# Patient Record
Sex: Female | Born: 1970 | Race: White | Hispanic: No | Marital: Married | Smoking: Never smoker
Health system: Southern US, Community
[De-identification: ages and names within clinical notes are randomized; demographics above are authoritative.]

## PROBLEM LIST (undated history)

## (undated) DIAGNOSIS — F32A Depression, unspecified: Secondary | ICD-10-CM

## (undated) DIAGNOSIS — S8992XA Unspecified injury of left lower leg, initial encounter: Secondary | ICD-10-CM

## (undated) DIAGNOSIS — O139 Gestational [pregnancy-induced] hypertension without significant proteinuria, unspecified trimester: Secondary | ICD-10-CM

## (undated) DIAGNOSIS — R51 Headache: Secondary | ICD-10-CM

## (undated) DIAGNOSIS — R42 Dizziness and giddiness: Secondary | ICD-10-CM

## (undated) DIAGNOSIS — F329 Major depressive disorder, single episode, unspecified: Secondary | ICD-10-CM

## (undated) DIAGNOSIS — G4700 Insomnia, unspecified: Secondary | ICD-10-CM

## (undated) DIAGNOSIS — M7750 Other enthesopathy of unspecified foot: Secondary | ICD-10-CM

## (undated) HISTORY — DX: Unspecified injury of left lower leg, initial encounter: S89.92XA

## (undated) HISTORY — PX: WISDOM TOOTH EXTRACTION: SHX21

## (undated) HISTORY — PX: OTHER SURGICAL HISTORY: SHX169

## (undated) HISTORY — DX: Dizziness and giddiness: R42

## (undated) HISTORY — DX: Insomnia, unspecified: G47.00

## (undated) HISTORY — PX: IUD REMOVAL: SHX5392

## (undated) HISTORY — DX: Other enthesopathy of unspecified foot and ankle: M77.50

---

## 2001-10-09 ENCOUNTER — Other Ambulatory Visit: Admission: RE | Admit: 2001-10-09 | Discharge: 2001-10-09 | Payer: Self-pay | Admitting: Obstetrics and Gynecology

## 2002-03-14 ENCOUNTER — Inpatient Hospital Stay (HOSPITAL_COMMUNITY): Admission: AD | Admit: 2002-03-14 | Discharge: 2002-03-14 | Payer: Self-pay | Admitting: Obstetrics and Gynecology

## 2002-03-18 ENCOUNTER — Inpatient Hospital Stay: Admission: AD | Admit: 2002-03-18 | Discharge: 2002-03-18 | Payer: Self-pay | Admitting: Obstetrics and Gynecology

## 2002-03-18 ENCOUNTER — Encounter: Payer: Self-pay | Admitting: Obstetrics and Gynecology

## 2002-08-24 ENCOUNTER — Inpatient Hospital Stay (HOSPITAL_COMMUNITY): Admission: AD | Admit: 2002-08-24 | Discharge: 2002-08-24 | Payer: Self-pay | Admitting: Obstetrics and Gynecology

## 2002-09-25 ENCOUNTER — Inpatient Hospital Stay (HOSPITAL_COMMUNITY): Admission: AD | Admit: 2002-09-25 | Discharge: 2002-09-25 | Payer: Self-pay | Admitting: Obstetrics and Gynecology

## 2002-11-05 ENCOUNTER — Inpatient Hospital Stay (HOSPITAL_COMMUNITY): Admission: AD | Admit: 2002-11-05 | Discharge: 2002-11-05 | Payer: Self-pay | Admitting: Obstetrics and Gynecology

## 2002-11-05 ENCOUNTER — Inpatient Hospital Stay (HOSPITAL_COMMUNITY): Admission: AD | Admit: 2002-11-05 | Discharge: 2002-11-07 | Payer: Self-pay | Admitting: Obstetrics and Gynecology

## 2002-12-20 ENCOUNTER — Other Ambulatory Visit: Admission: RE | Admit: 2002-12-20 | Discharge: 2002-12-20 | Payer: Self-pay | Admitting: *Deleted

## 2003-10-25 ENCOUNTER — Encounter: Admission: RE | Admit: 2003-10-25 | Discharge: 2003-10-25 | Payer: Self-pay | Admitting: Family Medicine

## 2003-11-15 ENCOUNTER — Encounter: Admission: RE | Admit: 2003-11-15 | Discharge: 2003-11-15 | Payer: Self-pay | Admitting: Family Medicine

## 2003-12-11 ENCOUNTER — Encounter: Admission: RE | Admit: 2003-12-11 | Discharge: 2003-12-11 | Payer: Self-pay | Admitting: Family Medicine

## 2004-02-11 ENCOUNTER — Other Ambulatory Visit: Admission: RE | Admit: 2004-02-11 | Discharge: 2004-02-11 | Payer: Self-pay | Admitting: Obstetrics and Gynecology

## 2004-03-18 ENCOUNTER — Other Ambulatory Visit: Admission: RE | Admit: 2004-03-18 | Discharge: 2004-03-18 | Payer: Self-pay | Admitting: Obstetrics and Gynecology

## 2004-07-09 ENCOUNTER — Inpatient Hospital Stay (HOSPITAL_COMMUNITY): Admission: AD | Admit: 2004-07-09 | Discharge: 2004-07-09 | Payer: Self-pay | Admitting: Obstetrics and Gynecology

## 2004-10-13 ENCOUNTER — Ambulatory Visit (HOSPITAL_COMMUNITY): Admission: RE | Admit: 2004-10-13 | Discharge: 2004-10-13 | Payer: Self-pay | Admitting: Obstetrics and Gynecology

## 2004-10-16 ENCOUNTER — Inpatient Hospital Stay (HOSPITAL_COMMUNITY): Admission: RE | Admit: 2004-10-16 | Discharge: 2004-10-16 | Payer: Self-pay | Admitting: Obstetrics and Gynecology

## 2005-01-02 ENCOUNTER — Inpatient Hospital Stay (HOSPITAL_COMMUNITY): Admission: AD | Admit: 2005-01-02 | Discharge: 2005-01-02 | Payer: Self-pay | Admitting: Obstetrics and Gynecology

## 2005-01-11 ENCOUNTER — Ambulatory Visit (HOSPITAL_COMMUNITY): Admission: RE | Admit: 2005-01-11 | Discharge: 2005-01-11 | Payer: Self-pay | Admitting: Obstetrics and Gynecology

## 2005-02-16 ENCOUNTER — Encounter (INDEPENDENT_AMBULATORY_CARE_PROVIDER_SITE_OTHER): Payer: Self-pay | Admitting: *Deleted

## 2005-02-16 ENCOUNTER — Inpatient Hospital Stay (HOSPITAL_COMMUNITY): Admission: AD | Admit: 2005-02-16 | Discharge: 2005-02-18 | Payer: Self-pay | Admitting: Obstetrics and Gynecology

## 2005-03-31 ENCOUNTER — Other Ambulatory Visit: Admission: RE | Admit: 2005-03-31 | Discharge: 2005-03-31 | Payer: Self-pay | Admitting: Obstetrics and Gynecology

## 2006-05-13 ENCOUNTER — Other Ambulatory Visit: Admission: RE | Admit: 2006-05-13 | Discharge: 2006-05-13 | Payer: Self-pay | Admitting: Obstetrics and Gynecology

## 2006-05-19 ENCOUNTER — Ambulatory Visit: Payer: Self-pay | Admitting: Family Medicine

## 2006-05-23 ENCOUNTER — Encounter: Admission: RE | Admit: 2006-05-23 | Discharge: 2006-05-23 | Payer: Self-pay

## 2006-12-05 ENCOUNTER — Ambulatory Visit: Payer: Self-pay | Admitting: Family Medicine

## 2007-03-14 ENCOUNTER — Inpatient Hospital Stay (HOSPITAL_COMMUNITY): Admission: AD | Admit: 2007-03-14 | Discharge: 2007-03-14 | Payer: Self-pay | Admitting: Obstetrics and Gynecology

## 2007-03-21 ENCOUNTER — Inpatient Hospital Stay (HOSPITAL_COMMUNITY): Admission: RE | Admit: 2007-03-21 | Discharge: 2007-03-21 | Payer: Self-pay | Admitting: Obstetrics and Gynecology

## 2007-06-21 ENCOUNTER — Inpatient Hospital Stay (HOSPITAL_COMMUNITY): Admission: AD | Admit: 2007-06-21 | Discharge: 2007-06-21 | Payer: Self-pay | Admitting: Obstetrics and Gynecology

## 2007-06-30 ENCOUNTER — Ambulatory Visit: Payer: Self-pay | Admitting: Internal Medicine

## 2007-08-22 ENCOUNTER — Inpatient Hospital Stay (HOSPITAL_COMMUNITY): Admission: RE | Admit: 2007-08-22 | Discharge: 2007-08-24 | Payer: Self-pay | Admitting: Obstetrics and Gynecology

## 2008-01-31 ENCOUNTER — Encounter: Admission: RE | Admit: 2008-01-31 | Discharge: 2008-01-31 | Payer: Self-pay | Admitting: Obstetrics and Gynecology

## 2008-02-22 ENCOUNTER — Ambulatory Visit (HOSPITAL_COMMUNITY): Admission: RE | Admit: 2008-02-22 | Discharge: 2008-02-22 | Payer: Self-pay | Admitting: Obstetrics and Gynecology

## 2008-08-20 ENCOUNTER — Ambulatory Visit: Payer: Self-pay | Admitting: Family Medicine

## 2008-08-20 DIAGNOSIS — G4700 Insomnia, unspecified: Secondary | ICD-10-CM | POA: Insufficient documentation

## 2008-08-21 ENCOUNTER — Encounter (INDEPENDENT_AMBULATORY_CARE_PROVIDER_SITE_OTHER): Payer: Self-pay | Admitting: *Deleted

## 2008-08-21 ENCOUNTER — Telehealth (INDEPENDENT_AMBULATORY_CARE_PROVIDER_SITE_OTHER): Payer: Self-pay | Admitting: *Deleted

## 2008-08-21 DIAGNOSIS — F329 Major depressive disorder, single episode, unspecified: Secondary | ICD-10-CM | POA: Insufficient documentation

## 2008-08-21 DIAGNOSIS — F321 Major depressive disorder, single episode, moderate: Secondary | ICD-10-CM | POA: Insufficient documentation

## 2008-08-21 LAB — CONVERTED CEMR LAB
Basophils Absolute: 0.1 10*3/uL (ref 0.0–0.1)
Calcium: 9.1 mg/dL (ref 8.4–10.5)
Eosinophils Absolute: 0.1 10*3/uL (ref 0.0–0.7)
GFR calc Af Amer: 104 mL/min
GFR calc non Af Amer: 86 mL/min
Lymphocytes Relative: 27.5 % (ref 12.0–46.0)
MCHC: 34.9 g/dL (ref 30.0–36.0)
MCV: 89.4 fL (ref 78.0–100.0)
Neutrophils Relative %: 65.1 % (ref 43.0–77.0)
Platelets: 193 10*3/uL (ref 150–400)
Potassium: 4.1 meq/L (ref 3.5–5.1)
RDW: 12 % (ref 11.5–14.6)
Sodium: 140 meq/L (ref 135–145)
Vitamin B-12: 290 pg/mL (ref 211–911)

## 2008-09-20 ENCOUNTER — Ambulatory Visit: Payer: Self-pay | Admitting: Family Medicine

## 2008-09-20 DIAGNOSIS — N61 Mastitis without abscess: Secondary | ICD-10-CM | POA: Insufficient documentation

## 2008-10-28 ENCOUNTER — Emergency Department (HOSPITAL_COMMUNITY): Admission: EM | Admit: 2008-10-28 | Discharge: 2008-10-28 | Payer: Self-pay | Admitting: Emergency Medicine

## 2009-05-30 ENCOUNTER — Ambulatory Visit: Payer: Self-pay | Admitting: Family Medicine

## 2009-05-30 DIAGNOSIS — M7710 Lateral epicondylitis, unspecified elbow: Secondary | ICD-10-CM | POA: Insufficient documentation

## 2009-06-06 ENCOUNTER — Telehealth: Payer: Self-pay | Admitting: Family Medicine

## 2009-06-06 LAB — CONVERTED CEMR LAB: Vit D, 25-Hydroxy: 41 ng/mL (ref 30–89)

## 2009-11-25 ENCOUNTER — Ambulatory Visit: Payer: Self-pay | Admitting: Family Medicine

## 2010-03-23 ENCOUNTER — Ambulatory Visit: Payer: Self-pay | Admitting: Family Medicine

## 2010-04-21 ENCOUNTER — Encounter (INDEPENDENT_AMBULATORY_CARE_PROVIDER_SITE_OTHER): Payer: Self-pay | Admitting: Nurse Practitioner

## 2010-09-18 ENCOUNTER — Telehealth (INDEPENDENT_AMBULATORY_CARE_PROVIDER_SITE_OTHER): Payer: Self-pay | Admitting: *Deleted

## 2010-10-13 NOTE — Assessment & Plan Note (Signed)
Summary: STREP THROAT/KDC   Vital Signs:  Patient profile:   40 year old female Height:      63 inches Weight:      163 pounds BMI:     28.98 Temp:     98.3 degrees F oral Pulse rate:   78 / minute Pulse rhythm:   regular BP sitting:   110 / 82  (left arm) Cuff size:   regular  Vitals Entered By: Army Fossa CMA (November 25, 2009 2:48 PM) CC: Pt here c/o sore throat and fever since last night. Fever got up to 100.3    History of Present Illness: Pt sore throat and fever x1 day.   No other symptoms.  No otc meds.    Current Medications (verified): 1)  Prozac 40 Mg Caps (Fluoxetine Hcl) .... Take 1 Tab Once Daily 2)  Implanon 68 Mg Impl (Etonogestrel) .... Use As Directed 3)  Calcium-Vitamin D 250-125 Mg-Unit Tabs (Calcium Carbonate-Vitamin D) .Marland Kitchen.. 1 By Mouth Once Daily. 4)  Vitamin D3 2000 Unit Caps (Cholecalciferol) .Marland Kitchen.. 1 By Mouth Daily.  Allergies (verified): No Known Drug Allergies  Past History:  Past Medical History: Last updated: 08/20/2008 Depression Current Problems:  INSOMNIA (ICD-780.52) DEPRESSION (ICD-311) DIZZINESS (ICD-780.4) VACCINE AGAINST INFLUENZA (ICD-V04.81)  Review of Systems      See HPI  Physical Exam  General:  Well-developed,well-nourished,in no acute distress; alert,appropriate and cooperative throughout examination Ears:  External ear exam shows no significant lesions or deformities.  Otoscopic examination reveals clear canals, tympanic membranes are intact bilaterally without bulging, retraction, inflammation or discharge. Hearing is grossly normal bilaterally. Nose:  External nasal examination shows no deformity or inflammation. Nasal mucosa are pink and moist without lesions or exudates. Mouth:  pharyngeal erythema, halitosis, and pharyngeal exudate.   Neck:  No deformities, masses, or tenderness noted. Lungs:  Normal respiratory effort, chest expands symmetrically. Lungs are clear to auscultation, no crackles or wheezes. Psych:   Oriented X3 and normally interactive.     Impression & Recommendations:  Problem # 1:  ACUTE PHARYNGITIS (ICD-462)  clinically strep  Orders: T-Culture, Throat (04540-98119) Admin of Therapeutic Inj  intramuscular or subcutaneous (14782) Bicillin CR 1.2 million units Injection (N5621) Rapid Strep (30865)  Instructed to complete antibiotics and call if not improved in 48 hours.   Complete Medication List: 1)  Prozac 40 Mg Caps (Fluoxetine hcl) .... Take 1 tab once daily 2)  Implanon 68 Mg Impl (Etonogestrel) .... Use as directed 3)  Calcium-vitamin D 250-125 Mg-unit Tabs (Calcium carbonate-vitamin d) .Marland Kitchen.. 1 by mouth once daily. 4)  Vitamin D3 2000 Unit Caps (Cholecalciferol) .Marland Kitchen.. 1 by mouth daily.  Patient Instructions: 1)  out of work 24 hours 2)  Take 400-600 mg of Ibuprofen (Advil, Motrin) with food every 4-6 hours as needed  for relief of pain or comfort of fever.    Medication Administration  Injection # 1:    Medication: Bicillin CR 1.2 million units Injection    Diagnosis: ACUTE PHARYNGITIS (ICD-462)    Route: IM    Site: RUOQ gluteus    Exp Date: 10/2011    Lot #: [id number]    Mfr: [employer]  Orders Added: 1)  T-Culture, Throat [[id number]] 2)  Admin of Therapeutic Inj  intramuscular or subcutaneous [96372] 3)  Bicillin CR 1.2 million units Injection [J0559] 4)  Est. Patient Level III [44010] 5)  Rapid Strep [27253]

## 2010-10-13 NOTE — Assessment & Plan Note (Signed)
Summary: rto 6 months.cbs   Vital Signs:  Patient profile:   40 year old female Height:      63 inches Weight:      178 pounds Temp:     98.2 degrees F oral Pulse rate:   73 / minute BP sitting:   80 / 50  (left arm)  Vitals Entered By: Jeremy Johann CMA (March 23, 2010 11:09 AM)  Serial Vital Signs/Assessments:  Time      Position  BP       Pulse  Resp  Temp     By                     98/62                          Loreen Freud DO  CC: 6 month f/u, disscuss med Comments REVIEWED MED LIST, PATIENT AGREED DOSE AND INSTRUCTION CORRECT    History of Present Illness: Pt here f/u prozac.  Pt has noticed recently increased moodiness and depression.  No known triggers. Pt is not suicidal.  Current Medications (verified): 1)  Prozac 20 Mg Caps (Fluoxetine Hcl) .... 3 By Mouth Once Daily 2)  Implanon 68 Mg Impl (Etonogestrel) .... Use As Directed  Allergies (verified): No Known Drug Allergies  Past History:  Past medical, surgical, family and social histories (including risk factors) reviewed for relevance to current acute and chronic problems.  Past Medical History: Reviewed history from 08/20/2008 and no changes required. Depression Current Problems:  INSOMNIA (ICD-780.52) DEPRESSION (ICD-311) DIZZINESS (ICD-780.4) VACCINE AGAINST INFLUENZA (ICD-V04.81)  Family History: Reviewed history and no changes required.  Social History: Reviewed history and no changes required.  Review of Systems      See HPI  Physical Exam  General:  Well-developed,well-nourished,in no acute distress; alert,appropriate and cooperative throughout examination Psych:  Oriented X3, normally interactive, good eye contact, not anxious appearing, and not depressed appearing.     Impression & Recommendations:  Problem # 1:  DEPRESSION (ICD-311)  Her updated medication list for this problem includes:    Prozac 20 Mg Caps (Fluoxetine hcl) .Marland KitchenMarland KitchenMarland KitchenMarland Kitchen 3 by mouth once daily  Complete Medication  List: 1)  Prozac 20 Mg Caps (Fluoxetine hcl) .... 3 by mouth once daily 2)  Implanon 68 Mg Impl (Etonogestrel) .... Use as directed Prescriptions: PROZAC 20 MG CAPS (FLUOXETINE HCL) 3 by mouth once daily  #90 x 11   Entered and Authorized by:   Loreen Freud DO   Signed by:   Loreen Freud DO on 03/23/2010   Method used:   Electronically to        [facility] [phone]* (retail)       [address]       [place], [place]  [address]       Ph: [phone]       Fax: [phone]   RxID:   [id number]

## 2010-10-13 NOTE — Letter (Signed)
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

## 2010-10-15 NOTE — Progress Notes (Signed)
Summary: Prior Auth NOT NEEDED NOW PROZAC CATALYST  Phone Note Refill Request Message from:  Pharmacy on September 18, 2010 2:05 PM  Refills Requested: Medication #1:  PROZAC 20 MG CAPS 3 by mouth once daily   Dosage confirmed as above?Dosage Confirmed   Brand Name Necessary? No Prior Auth from [employer] [facility] on [address]   Initial call taken by: Harold Barban,  September 18, 2010 2:09 PM  Follow-up for Phone Call        Left message to call office to verify if Pt has tried any other meds................Marland KitchenFelecia Deloach CMA  September 21, 2010 10:26 AM  Left message to call office...............Marland KitchenFelecia Deloach CMA  September 24, 2010 2:57 PM   Pt states that she has tried paxil in the pass with no results. Pt also notes that she has since filled med and no longer need med to be PA. Per Pt pharmacy was submitting med for incorrect quantity. Pt advise will not proceed with PA at this time but in the future if it is necessary to we will.............Marland KitchenFelecia Deloach CMA  September 24, 2010 3:14 PM

## 2010-11-12 DIAGNOSIS — M7750 Other enthesopathy of unspecified foot: Secondary | ICD-10-CM

## 2010-11-12 HISTORY — DX: Other enthesopathy of unspecified foot and ankle: M77.50

## 2010-12-28 ENCOUNTER — Encounter: Payer: Self-pay | Admitting: Family Medicine

## 2010-12-28 ENCOUNTER — Ambulatory Visit (INDEPENDENT_AMBULATORY_CARE_PROVIDER_SITE_OTHER): Payer: 59 | Admitting: Family Medicine

## 2010-12-28 VITALS — BP 105/74 | Ht 63.5 in | Wt 170.0 lb

## 2010-12-28 DIAGNOSIS — M25579 Pain in unspecified ankle and joints of unspecified foot: Secondary | ICD-10-CM

## 2010-12-28 DIAGNOSIS — M25571 Pain in right ankle and joints of right foot: Secondary | ICD-10-CM

## 2010-12-28 DIAGNOSIS — M7750 Other enthesopathy of unspecified foot: Secondary | ICD-10-CM

## 2010-12-28 DIAGNOSIS — M7670 Peroneal tendinitis, unspecified leg: Secondary | ICD-10-CM | POA: Insufficient documentation

## 2010-12-28 NOTE — Progress Notes (Signed)
  Subjective:    Patient ID: Stacey Hickman, female    DOB: 1970/12/06, 40 y.o.   MRN: [id number]  HPI  Right posterior ankle pain for 10 days. Recalls no specific injury. She's had no new activities, no new footwear. Pain is behind the ankle bone, there is some swelling associated with it. There is no warmth or redness. Pain is worse with a lot of extended walking or standing. She is to do both at her job. She's had no prior ankle injury.  Review of Systems She has lost about 10 pounds intentionally over the last few months using phenteramine again and walking as exercise    Objective:   Physical Exam    GENERAL: Well-developed, well-nourished no acute distress GAIT: normal stride length. Normal heel toe movement of the foot bilaterally without any particular pathology. FOOT: Tender to palpation over the peroneal tendons on the right and this reproduces her pain there is no subluxation noted she is neurovascularly intact. The ankle is ligamentously intact.  ULTRASOUND: Significant amount of fluid around the peroneal tendons on the right. There is no subluxation noted    Assessment & Plan:  Peroneal tendinitis We'll place her in [employer] boot as she does a significant amount of standing and walking at her job. Please see patient instructions for weaning plan. We'll see her back in 2 weeks.

## 2010-12-28 NOTE — Patient Instructions (Signed)
Please wear the "boot" pretty much all of the time during the day--through the 26th---then start coming out of boot for two hours a day on the 27 th. If that is fine then do 4 hours on the 28th and 29th and see me on 30th. Wear boot 4 hours on 30th unless you are feeling GREAT!!! In which case you can wear it two hours.  ICE--if you can--for about twenty minutes once or twice a day until I see you back>  Call [facility] with any problems ([place] or Amy) Great to see you!

## 2010-12-29 ENCOUNTER — Other Ambulatory Visit: Payer: Self-pay | Admitting: Obstetrics and Gynecology

## 2010-12-29 DIAGNOSIS — Z1231 Encounter for screening mammogram for malignant neoplasm of breast: Secondary | ICD-10-CM

## 2011-01-12 ENCOUNTER — Ambulatory Visit (INDEPENDENT_AMBULATORY_CARE_PROVIDER_SITE_OTHER): Payer: 59 | Admitting: Family Medicine

## 2011-01-12 ENCOUNTER — Encounter: Payer: Self-pay | Admitting: Family Medicine

## 2011-01-12 VITALS — BP 110/76

## 2011-01-12 DIAGNOSIS — M7750 Other enthesopathy of unspecified foot: Secondary | ICD-10-CM

## 2011-01-12 DIAGNOSIS — M79673 Pain in unspecified foot: Secondary | ICD-10-CM

## 2011-01-12 DIAGNOSIS — M79609 Pain in unspecified limb: Secondary | ICD-10-CM

## 2011-01-12 DIAGNOSIS — M7670 Peroneal tendinitis, unspecified leg: Secondary | ICD-10-CM

## 2011-01-12 MED ORDER — NITROGLYCERIN 0.2 MG/HR TD PT24: MEDICATED_PATCH | TRANSDERMAL | Status: DC

## 2011-01-12 NOTE — Patient Instructions (Addendum)
Peroneal and arch rehab Begin with easy walking, heel, toe and backwards * Try to pick an easy location like a hallway or a room in your house and do one of these each time that you go through this area. AT THE BEGINNING GO REALLY EASY  Calf raises on a step - ECCENTRIC OR LOWERS Try to do most days of the week If pain persists at 3 sets of 30 - add backpack with 5 lbs Increase by 5 lbs per week to max of 30 lbs  YOU CAN DO THESE A LOT RIGHT AT THE BEGINNING Towel "Scrunch Ups" Use a hand towel or a moderate size towel Foot flat down on the towel Use toes to "scrunch up the towel" straight up and down, and going to the right and left.  3 sets of 20 * Can be done watching TV, reading, or sitting and relaxing.  MOST IMPORTANT: WHILE BRUSHING TEETH, STAND ON YOUR FEET AS LONG AS POSSIBLE ON 1 LEG WHEN YOU CAN DO FOR MORE THAN 30 SECONDS STAND WITH EYES CLOSED RIGHT FOOT  LEFT FOOT AT LEAST A MINUTE IN TOTAL EACH  Push inward and outward  With a soupcan / can of beans, etc.  RECHECK IN 4 weeks

## 2011-01-12 NOTE — Assessment & Plan Note (Signed)
Will discontinue CAM walker, start 1/4 0.2mg  nitroglycerin patch daily.  Discussed exercise therapy program- see patient instructions for detail.  Use lace-up ASO while standing at work.  Follow-up in 4 weeks.

## 2011-01-12 NOTE — Progress Notes (Signed)
  Subjective:    Patient ID: Stacey Hickman, female    DOB: October 16, 1970, 40 y.o.   MRN: [id number]  HPISeen by Dr. Jennette Kettle in [facility] April 16th, dx with peroneal tendonitis on exam and ultrasound.  Patient was placed in cam walker for 10 days, and then asked to taper off use.  Patient notes tapering caused quick relapse into pain.  Also noticed pain even when walking on it at night without her boot.  Has been taking ibuprofen regularly.    The PMH, PSH, Social History, Family History, Medications, and allergies have been reviewed in [facility], and have been updated if relevant.   Review of Systems REVIEW OF SYSTEMS  GEN: No fevers, chills. Nontoxic. Primarily MSK c/o today. MSK: Detailed in the HPI GI: tolerating PO intake without difficulty Neuro: No numbness, parasthesias, or tingling associated. Otherwise the pertinent positives of the ROS are noted above.      Objective:   Physical Exam  GEN: Well-developed,well-nourished,in no acute distress; alert,appropriate and cooperative throughout examination HEENT: Normocephalic and atraumatic without obvious abnormalities. Ears, externally no deformities PULM: Breathing comfortably in no respiratory distress EXT: No clubbing, cyanosis, or edema PSYCH: Normally interactive. Cooperative during the interview. Pleasant. Friendly and conversant. Not anxious or depressed appearing. Normal, full affect.  FEET: Echymosis: no Edema: no ROM: full LE B Gait: heel toe, non-antalgic MT pain: no Callus pattern: none Lateral Mall: NT Medial Mall: NT Talus: NT Navicular: NT Cuboid: NT Calcaneous: NT Metatarsals: NT 5th MT: NT Phalanges: NT Achilles: NT Plantar Fascia: NT Fat Pad: NT Peroneals: NOTED Post Tib: NT Great Toe: Nml motion Ant Drawer: neg ATFL: NT CFL: NT Deltoid: NT Sensation: intact   Assessment & Plan:

## 2011-01-13 ENCOUNTER — Ambulatory Visit
Admission: RE | Admit: 2011-01-13 | Discharge: 2011-01-13 | Disposition: A | Discharge status: Home / Self Care | Payer: 59 | Source: Ambulatory Visit | Attending: Obstetrics and Gynecology | Admitting: Obstetrics and Gynecology

## 2011-01-13 DIAGNOSIS — Z1231 Encounter for screening mammogram for malignant neoplasm of breast: Secondary | ICD-10-CM

## 2011-01-26 NOTE — H&P (Signed)
NAME:  Stacey Hickman, Stacey Hickman                 ACCOUNT NO.:  [id number]   MEDICAL RECORD NO.:  [id number]          PATIENT TYPE:  AMB   LOCATION:  SDC                           [facility]ILITY:  [facility]   PHYSICIAN:  Hal Morales, M.D.DATE OF BIRTH:  27-Jun-1971   DATE OF ADMISSION:  02/22/2008  DATE OF DISCHARGE:                              HISTORY & PHYSICAL   HISTORY OF PRESENT ILLNESS:  The patient is a 40 year old white female,  para 3-0-0-3 who presents for removal of a misplaced IUD.  The patient  had a Mirena IUD placed without difficulty on October 31, 2007.  The  patient returned on December 26, 2007 for an IUD check and no string was  visible.  On December 29, 2007, she underwent a pelvic ultrasound, which  showed the IUD located in the lower uterine segment.  Under ultrasound  guidance, the IUD was replaced in the fundus of the uterus without  difficulty.  The patient was treated with Augmentin and had no further  discomfort.  She presented on Jan 16, 2008 for evaluation of IUD  placement and, again, the string was not visible.  When ultrasound was  performed on May 05, no IUD could be seen in the endometrial cavity.  A  subsequent abdominal ultrasound showed an IUD in the anterior aspect of  the right upper quadrant of the abdomen with an unremarkable gas pattern  in the bowel and no free intraperitoneal air.  At that time, the patient  was advised that the IUD would need to be removed and she presents for  that at this time.   PAST OBSTETRICAL HISTORY:  1. Vaginal delivery 2004.  2. Vaginal delivery 2006 of a female infant with trisomy 63.  3. Vaginal delivery 2008 of a female infant without difficulty.  4. Current contraception, Implenon, was placed on February 12, 2008.   MEDICAL HISTORY:  The patient has a history of depression.   PRIOR SURGICAL HISTORY:  Removal of third molar.   CURRENT MEDICATIONS:  None.   DRUG SENSITIVITIES:  None.   FAMILY HISTORY:  Positive for heart disease,  hypertension, anemia,  emphysema, diabetes, arthritis and skin cancer.   SOCIAL HISTORY:  The patient is married and works as a Teacher, early years/pre and  denies any alcohol, tobacco or drug use.   PHYSICAL EXAMINATION:  The patient is a well-developed white female in  no acute distress.  The blood pressure is 122/80  LUNGS:  Clear.  HEART: Regular rate and rhythm.  ABDOMEN:  Benign.Marland Kitchen  PELVIC:  EBGUS within normal limits.  The vagina is rugous.  The cervix  is without gross lesions.  The uterus is normal size, shape,  consistency, mobile and nontender.  Adnexa:  No masses.   IMPRESSION:  Misplaced IUD.   DISPOSITION:  The patient will undergo laparoscopic removal of the  Mirena IUD.  The risks of anesthesia, bleeding, infection and damage to  adjacent organs have all been explained to the patient and she wishes to  proceed and this will be performed at [facility] on February 22, 2008.  Hal Morales, M.D.  Electronically Signed     VPH/MEDQ  D:  02/21/2008  T:  02/21/2008  Job:  [id number]

## 2011-01-26 NOTE — Op Note (Signed)
NAME:  Stacey Hickman, Stacey Hickman                 ACCOUNT NO.:  [id number]   MEDICAL RECORD NO.:  [id number]          PATIENT TYPE:  AMB   LOCATION:  SDC                           [facility]   PHYSICIAN:  Hal Morales, M.D.DATE OF BIRTH:  Feb 04, 1971   DATE OF PROCEDURE:  02/22/2008  DATE OF DISCHARGE:                               OPERATIVE REPORT   PREOPERATIVE DIAGNOSIS:  Displaced intrauterine device.   POSTOPERATIVE DIAGNOSIS:  Displaced intrauterine device.   OPERATION:  Operative laparoscopy with laparoscopic removal of intra-  abdominal intrauterine device.   ANESTHESIA:  General orotracheal.   ESTIMATED BLOOD LOSS:  Less than 10 mL.   COMPLICATIONS:  None.   FINDINGS:  The uterus, tubes, and ovaries were within normal limits.  The posterior uterine fundus contained an area that appeared to have  recently healed though no defect in the uterus could be appreciated.  The IUD was in the right upper quadrant lying on the bowel omentum.  The  liver edge appeared normal.   PROCEDURE:  The patient was taken to the operating room.  After  appropriate identification, placed on the operating table.  After the  attainment of adequate general anesthesia, she was placed in modified  lithotomy position.  The abdomen, perineum, and vagina were prepped with  multiple layers of Betadine.  The Foley catheter inserted into the  bladder and then connected to straight drainage.  A Hulka tenaculum was  placed on the anterior cervix.  The abdomen was draped as sterile field.  Subumbilical and suprapubic injections of 0.25% Marcaine were undertaken  for total of 8 mL.  Subumbilical incision was made and a Veress cannula  placed through that incision into the peritoneal cavity.  A  pneumoperitoneum was created with 3.5 L of CO2.  Veress cannula was  removed and laparoscopic trocar placed through that incision into the  perineal cavity.  A laparoscope was placed in the trocar sleeve.  A  suprapubic  incision was made to the right of midline and the  laparoscopic probe trocar placed through that incision into the perineal  cavity under direct visualization.  The above-noted findings were made  and documented.  An atraumatic grasper was then used to elevate the IUD  through the 10-mm subumbilical port and it was removed from the  abdominal cavity.  Hemostasis was noted to be adequate.  The above-noted  findings were then documented and all instruments removed from the  peritoneal cavity under direct visualization, as CO2 was allowed to  escape.  The subumbilical incision was closed with a fascial suture of 0  Vicryl in figure-of-eight fashion.  The skin incision was closed with a  subcuticular suture of 3-0 Vicryl.  The suprapubic incision was closed  with a  subcuticular suture of 3-0 Vicryl.  Sterile dressings were applied, the  Foley and Hulka tenaculum were removed.  The patient was awakened from  general anesthesia and taken to the recovery room in satisfactory  condition having tolerated the procedure well with sponge and instrument  counts correct.      Maris Berger Haygood,  M.D.  Electronically Signed     VPH/MEDQ  D:  02/22/2008  T:  02/23/2008  Job:  [id number]

## 2011-01-26 NOTE — H&P (Signed)
NAME:  Stacey Hickman, Stacey Hickman                 ACCOUNT NO.:  [id number]   MEDICAL RECORD NO.:  [id number]          PATIENT TYPE:  INP   LOCATION:  9163                          [facility]ILITY:  [facility]   PHYSICIAN:  Crist Fat. Rivard, M.D. DATE OF BIRTH:  1971/05/20   DATE OF ADMISSION:  08/22/2007  DATE OF DISCHARGE:                              HISTORY & PHYSICAL   HISTORY OF PRESENT ILLNESS:  This is a 40 year old gravida 3, para 2-0-0-  2 at 39-2/7 weeks who presents with irregular contractions starting this  morning.  She was scheduled for elective induction today.  She reports  positive fetal movement.  Denies any leaking or bleeding.  Pregnancy has  been followed by the nurse midwife service and remarkable for:  1. AMA.  2. Rh negative.  3. Last child with trisomy 70, with this pregnancy having a normal      amnio.  4. Group B strep negative.   ALLERGIES:  None.   OBSTETRICAL HISTORY:  Remarkable for vaginal delivery in 2004 of a  female infant at [redacted] weeks gestation weighing 8 pounds 4 ounces.  She had  a vaginal delivery 2006 of a female infant at [redacted] weeks gestation weighing  6 pounds 15 ounces remarkable for trisomy 56.   MEDICAL HISTORY:  1. Remarkable for Rh negative.  2. Childhood varicella.  3. History of depression.   SURGICAL HISTORY:  Remarkable for wisdom teeth.   FAMILY HISTORY:  Remarkable for grandfather with heart disease; brother,  father, uncles and mother with hypertension; brother with anemia;  grandfather with emphysema; father and brother with diabetes; cousin  with arthritis; and mother with skin cancer.   GENETIC HISTORY:  Remarkable for the patient's age of 69 and second  child who has Down syndrome.   SOCIAL HISTORY:  The patient is married to [employer] who is involved  and supportive.  She is of the [employer].  She works as a  Teacher, early years/pre.  He works as a Clinical research associate.  She denies any alcohol, tobacco or  drug use.   PRENATAL LABORATORIES:  Hemoglobin 14,  platelets 237.  Blood type A  negative, antibody screen negative, RPR nonreactive, rubella immune.  Hepatitis negative, HIV negative.  Pap test normal.  Cystic fibrosis  negative history of current pregnancy.  The patient entered care at [redacted]  weeks gestation.  She had first trimester screen that had a normal  ultrasound component, but was otherwise increased risk for Down  syndrome.  She had an amniocentesis which did show a normal 89 XX  chromosome, karyotype.  She had ultrasound at 19 weeks that was normal.  She had some contractions at 27 weeks.  Fetal fibronectin was done and  was negative.  She had an elevated Glucola with a normal 3-hour GTT and  Group B strep was negative at term.  Ultrasound for growth at 35 weeks  showed normal growth at 86 percentile and ultrasound at 39 weeks showed  AFI of 20th percentile with a BPP of 8/8.   OBJECTIVE:  VITAL SIGNS:  Stable, afebrile.  HEENT:  Within normal  limits.  Thyroid normal, not enlarged.  CHEST:  Clear to auscultation.  HEART:  Regular rate and rhythm.  ABDOMEN:  Gravid, vertex [place].  CFM shows reactive fetal heart rate  with contractions every 4 minutes.  Cervix was 203 cm, 90% effaced  and  -2 station with a vertex presentation.  EXTREMITIES:  Within normal limits.   ASSESSMENT:  1. Intrauterine pregnancy at 39-2/7 weeks.  2. Active labor.  3. Group B strep negative.   PLAN:  1. Admit to birthing suites.  2. Routine CNM orders.  3. Desires nonintervention birth for now.      Marie L. Williams, C.N.M.      Crist Fat Rivard, M.D.  Electronically Signed    MLW/MEDQ  D:  08/22/2007  T:  08/22/2007  Job:  [id number]

## 2011-01-29 NOTE — Op Note (Signed)
NAMEAMERIKA, NOURSE                 ACCOUNT NO.:  [id number]   MEDICAL RECORD NO.:  [id number]          PATIENT TYPE:  MAT   LOCATION:  MATC                          [facility]   PHYSICIAN:  Osborn Coho, M.D.   DATE OF BIRTH:  18-Oct-1970   DATE OF PROCEDURE:  10/16/2004  DATE OF DISCHARGE:                                 OPERATIVE REPORT   PREPROCEDURE DIAGNOSES:  1.  Increased risk for Down's syndrome.  2.  An 18-6/7 weeks' gestation.   POSTPROCEDURE DIAGNOSES:  1.  Increased risk for Down's syndrome.  2.  An 18-6/7 weeks' gestation.   PROCEDURE:  Amniocentesis.   COMPLICATIONS:  None.   FINDINGS:  Large pocket of fluid near the fundus with no cord by Doppler.   DESCRIPTION OF PROCEDURE:  Under ultrasound guidance, a pocket of fluid was  identified with no cord verified by Doppler.  The patient's abdomen was  prepped with Betadine, and a pool of 10 mL of 1% lidocaine was administered.  Radiolucent needle was introduced at the fundus through the tip of the  placenta into the pocket of fluid that had been identified.  The needle was  introduced under ultrasound guidance, and a total of approximately 30 mL of  clear fluid returned.  The needle was removed, and fetal heart rate  postprocedure was in the 140s.  The area of the placenta was identified, and  no apparent abruption noted.  The patient tolerated the procedure well.  The  patient is to be transferred to the MAU for RhoGAM secondary to Rh negative  status.      AR/MEDQ  D:  10/16/2004  T:  10/16/2004  Job:  [id number]

## 2011-01-29 NOTE — H&P (Signed)
Stacey Hickman, Stacey Hickman                 ACCOUNT NO.:  [id number]   MEDICAL RECORD NO.:  [id number]          PATIENT TYPE:  INP   LOCATION:  9129                          [facility]ILITY:  [facility]   PHYSICIAN:  Osborn Coho, M.D.   DATE OF BIRTH:  07-Jul-1971   DATE OF ADMISSION:  02/16/2005  DATE OF DISCHARGE:                                HISTORY & PHYSICAL   Stacey Hickman is a 40 year old gravida 2 para 1-0-0-1 at 36-4/7 weeks who  presented complaining of uterine contractions since approximately 8:00 p.m.  with gradually increasing intensity since approximately 3:00 a.m.  Pregnancy  has been remarkable for:  1)  Fetus with trisomy 21 by amniocentesis; 2)  Rh  negative; 3)  Beta Streptococcus negative; 4)  History of irregular cycles;  5)  First trimester bleeding.   PRENATAL LABORATORIES:  Blood type is A negative.  Rh antibody was positive,  anti-D.  RPR was nonreactive.  Rubella titer was positive.  Hepatitis B  surface antigen negative.  HIV nonreactive.  __________ testing was  negative.  Pap was normal in July 2005.  GC and Chlamydia cultures were  negative in October 2005.  AFP showed an increased Down's syndrome risk.  The patient also had an amniocentesis that confirmed trisomy 71.  Hemoglobin  upon entry into practice was 14.5.  It was within normal limits at 26 weeks.  Group B Streptococcus culture was negative at 36 weeks.  EDC of March 13, 2005  was established by a 6-week ultrasound and was in agreement with ultrasound  at approximately 18 weeks.   HISTORY OF PRESENT PREGNANCY:  The patient entered care at approximately 10-  11 weeks.  She had quadruple screen which showed an abnormal quadruple  screen.  Ultrasound at [facility] was done for dating and anatomy.  Risk for Down's syndrome by quadruple screen was 1 in 90.  Amniocentesis was  elected.  She also had a fetal echocardiogram that was normal.  Amniocentesis was done on October 16, 2004.  Results showed amnio positive  for trisomy 21.  Fetal echocardiogram was negative.  The patient was then  followed closely with her pregnancy for growth.  She had an ultrasound at 26  weeks that showed normal growth and fluid.  She received RhoGAM at 29 weeks.  She had another ultrasound at 31 weeks showing greater than the 95%  percentile of growth, and fluid at 22.3.  Beta Streptococcus was negative.  She had another ultrasound on Monday of this week.  Results are not present  in the chart.   OBSTETRICAL HISTORY:  In 2004, she had a vaginal birth of a female infant  who weighed 8 pounds 4 ounces at 39-4/7 weeks.  She was in labor 26 hours.  She had epidural anesthesia.  During that pregnancy, she did have some  preterm uterine contractions but had a full-term delivery.   MEDICAL HISTORY:  1.  She is a previous oral contraceptive user.  2.  She has had a history of UTI x 1.  3.  She had a history of depression in the  past.  4.  She had her wisdom teeth removed at age 47 for her only surgery.   She has no known medication allergies.   FAMILY HISTORY:  Her paternal grandfather and father had a heart attack.  Her brother, father, paternal uncle, and mother have varicosities.  She has  a cousin with rheumatoid arthritis.  Her mother had skin cancer.  Maternal  aunt had multiple kinds of cancer with breast cancer.  Brother has  hemochromatosis.  Maternal grandfather had emphysema.   GENETIC HISTORY:  Remarkable for the patient's nephew having cerebral palsy.   SOCIAL HISTORY:  The patient is married to the father of the baby.  He is  involved and supportive.  His name is Chief Operating Officer.  The patient is  Caucasian, of the Catholic faith.  She is graduate educated.  She is  employed as a Teacher, early years/pre.  Her husband is also graduate educated.  He is a  Clinical research associate.  She has been followed by the physician and nurse midwife service at  [facility] and collaborative care.  She denies any alcohol, drug,  or tobacco use  during this pregnancy.   PHYSICAL EXAMINATION:  VITAL SIGNS:  Stable.  The patient is afebrile.  HEENT:  Within normal limits.  LUNGS:  Bilateral breath sounds are clear.  HEART:  Regular rate and rhythm, without murmur.  BREASTS:  Soft and nontender.  ABDOMEN:  Fundal height is approximately 36 cm.  PELVIC:  Uterine contractions are every 2 minutes and strong.  Cervical exam  7-8 cm, 90%, vertex, at a 0 station, with bulging bag of water.  On arrival,  fetal heart rate is reassuring.  EXTREMITIES:  Deep tendon reflexes are 2+, without clonus.  There is some  trace edema noted.   IMPRESSION:  1.  Intrauterine pregnancy at 36-4/7 weeks.  2.  Transition labor.  3.  Fetus with trisomy 21 by amniocentesis.  4.  Group B Streptococcus negative.  5.  Rh negative.   PLAN:  1.  Admit to birthing suite for consult with Dr. Osborn Coho, who is      attending physician.  2.  Routine certified nurse midwife orders.  3.  The patient desires epidural, but I doubt that we will be able to      accomplish that in light of the rapid progression of her labor.  4.  Nursery will be notified of the prior diagnosis of trisomy 25 for this      fetus.       VLL/MEDQ  D:  02/16/2005  T:  02/16/2005  Job:  [id number]

## 2011-01-29 NOTE — H&P (Signed)
NAME:  Stacey Hickman, Stacey Hickman                           ACCOUNT NO.:  [id number]   MEDICAL RECORD NO.:  [id number]                   PATIENT TYPE:  MAT   LOCATION:  MATC                                 [facility]ILITY:  [facility]   PHYSICIAN:  Crist Fat. Rivard, M.D.              DATE OF BIRTH:  Aug 16, 1971   DATE OF ADMISSION:  11/05/2002  DATE OF DISCHARGE:                                HISTORY & PHYSICAL   HISTORY OF PRESENT ILLNESS:  This is a 40 year old, gravida 1, para 0 at 29-  3/7 weeks who presents with uterine contractions reported stronger since 4  a.m. She denies bleeding or leaking and reports positive fetal movement.  Pregnancy has been remarkable for:  1. Rh negative.  2. First trimester bleeding.  3. First trimester radiation exposure.  4. Preterm labor.  5. Group B strep negative.   OBSTETRICAL HISTORY:  The patient is primigravida.   MEDICAL HISTORY:  Remarkable for childhood varicella.   SURGICAL HISTORY:  Remarkable for wisdom teeth extraction at age 20.   FAMILY HISTORY:  Remarkable for heart disease in her grandfather and father  and grandmother. Hypertension in her brother, father, and uncle.  Hemochromatosis in her brother with carrier status in her father and mother.  Anemia in her sister. Emphysema in her grandfather. Diabetes in her brother,  father, and grandfather. Cancer in her mother, aunt, and grandmother.  Genetic history is remarkable for cerebral palsy in her nephew related to  birth injury.   PRENATAL LABORATORY DATA:  Hemoglobin 13.7. Blood type A-. Antibody screen  negative. RPR nonreactive. Rubella immune. HPV negative. HIV nonreactive.  Pap test normal. AFP/free beta within normal limits. Glucose challenge  within normal limits. Group B strep negative.   SOCIAL HISTORY:  The patient is married to [employer] who is involved and  supportive. She is of the [employer]. She denies any alcohol, tobacco,  or drug use.   OBJECTIVE:  VITAL SIGNS:  Stable,  afebrile.  HEENT:  Within normal limits.  NECK:  Thyroid normal, not enlarged.  CHEST:  Clear to auscultation.  HEART:  Regular rate and rhythm.  ABDOMEN:  Gravid at 38 cm, vertex Leopold's. EFM shows a reactive fetal  heart rate tracing with no deceleration, uterine contractions every 2 to 4  minutes.  CERVICAL EXAM:  4, 90%, -1 vertex, bulging membranes. Previous exam was 3  cm.  EXTREMITIES:  Within normal limits.   ASSESSMENT:  1. Intrauterine pregnancy at 39-3/7 weeks.  2. Early active labor.  3. Group B strep negative.   PLAN:  1. Admit to birthing suite, Dr. Estanislado Pandy notified.  2. Routine CNM orders.  3. Nubain followed by epidural p.r.n.     Marie L. Williams, C.N.M.                 Crist Fat Rivard, M.D.    MLW/MEDQ  D:  11/05/2002  T:  11/05/2002  Job:  [id number]

## 2011-02-09 ENCOUNTER — Encounter: Payer: Self-pay | Admitting: Family Medicine

## 2011-02-09 ENCOUNTER — Ambulatory Visit (INDEPENDENT_AMBULATORY_CARE_PROVIDER_SITE_OTHER): Payer: 59 | Admitting: Family Medicine

## 2011-02-09 VITALS — BP 100/70

## 2011-02-09 DIAGNOSIS — M7750 Other enthesopathy of unspecified foot: Secondary | ICD-10-CM

## 2011-02-09 DIAGNOSIS — M7670 Peroneal tendinitis, unspecified leg: Secondary | ICD-10-CM

## 2011-02-09 NOTE — Progress Notes (Signed)
  Subjective:    Patient ID: Stacey Hickman, female    DOB: 1970-12-25, 40 y.o.   MRN: [id number]  HPI Seen by Dr. Jennette Kettle in [facility] April 16th, dx with peroneal tendonitis on exam and ultrasound.  Patient was placed in cam walker for 10 days, seen again and transition to ASO and nitro patch.  .  Pt states she is doing much better with 75% reduction in pain.  Able to do all activities of daily living without any problems.  Still a little tenderness with the exercises especiallyy the toe raises otherwise doing well.  Has not been using the nitro patch due to irritation and has been out of the ASO brace for the last  2 weeks.  Pt only take motrin 1-2 week at this time.   The PMH, PSH, Social History, Family History, Medications, and allergies have been reviewed in [facility], and have been updated if relevant.   Review of Systems  REVIEW OF SYSTEMS GEN: No fevers, chills. Nontoxic. Primarily MSK c/o today. MSK: Detailed in the HPI GI: tolerating PO intake without difficulty Neuro: No numbness, parasthesias, or tingling associated. Otherwise the pertinent positives of the ROS are noted above.      Objective:   Physical Exam   GEN: Well-developed,well-nourished,in no acute distress; alert,appropriate and cooperative throughout examination HEENT: Normocephalic and atraumatic without obvious abnormalities. Ears, externally no deformities PULM: Breathing comfortably in no respiratory distress EXT: No clubbing, cyanosis, or edema   FEET: Echymosis: no Edema: no ROM: full LE B MT pain: no Callus pattern: none Lateral Mall: NT Medial Mall: NT Talus: NT Navicular: NT Cuboid: NT Calcaneous: NT Metatarsals: NT 5th MT: NT Phalanges: NT Achilles: NT Plantar Fascia: NT Fat Pad: NT Peroneals: NT no swelling or edema noted.  Post Tib: NT Great Toe: Nml motion Ant Drawer: neg ATFL: NT CFL: NT Deltoid: NT Sensation: intact   Assessment & Plan:

## 2011-02-09 NOTE — Assessment & Plan Note (Signed)
Pt has improved very well.  Progress to light exercise and titrate up duration and frequency.  Discussed if pt has symptoms return to restart ASO and NSAIDS and restart exercises again. Pt in agreement with plan.  F/u PRN.

## 2011-03-14 DIAGNOSIS — S8992XA Unspecified injury of left lower leg, initial encounter: Secondary | ICD-10-CM

## 2011-03-14 HISTORY — DX: Unspecified injury of left lower leg, initial encounter: S89.92XA

## 2011-03-31 ENCOUNTER — Emergency Department (INDEPENDENT_AMBULATORY_CARE_PROVIDER_SITE_OTHER): Payer: 59

## 2011-03-31 ENCOUNTER — Emergency Department (HOSPITAL_BASED_OUTPATIENT_CLINIC_OR_DEPARTMENT_OTHER)
Admission: EM | Admit: 2011-03-31 | Discharge: 2011-03-31 | Disposition: A | Discharge status: Home / Self Care | Payer: 59 | Attending: Emergency Medicine | Admitting: Emergency Medicine

## 2011-03-31 ENCOUNTER — Encounter (HOSPITAL_BASED_OUTPATIENT_CLINIC_OR_DEPARTMENT_OTHER): Payer: Self-pay | Admitting: *Deleted

## 2011-03-31 ENCOUNTER — Emergency Department (HOSPITAL_BASED_OUTPATIENT_CLINIC_OR_DEPARTMENT_OTHER): Payer: 59

## 2011-03-31 DIAGNOSIS — F329 Major depressive disorder, single episode, unspecified: Secondary | ICD-10-CM | POA: Insufficient documentation

## 2011-03-31 DIAGNOSIS — X58XXXA Exposure to other specified factors, initial encounter: Secondary | ICD-10-CM | POA: Insufficient documentation

## 2011-03-31 DIAGNOSIS — S8390XA Sprain of unspecified site of unspecified knee, initial encounter: Secondary | ICD-10-CM

## 2011-03-31 DIAGNOSIS — M25569 Pain in unspecified knee: Secondary | ICD-10-CM | POA: Insufficient documentation

## 2011-03-31 DIAGNOSIS — IMO0002 Reserved for concepts with insufficient information to code with codable children: Secondary | ICD-10-CM | POA: Insufficient documentation

## 2011-03-31 DIAGNOSIS — F3289 Other specified depressive episodes: Secondary | ICD-10-CM | POA: Insufficient documentation

## 2011-03-31 DIAGNOSIS — W19XXXA Unspecified fall, initial encounter: Secondary | ICD-10-CM

## 2011-03-31 HISTORY — DX: Depression, unspecified: F32.A

## 2011-03-31 HISTORY — DX: Major depressive disorder, single episode, unspecified: F32.9

## 2011-03-31 LAB — PREGNANCY, URINE: Preg Test, Ur: NEGATIVE

## 2011-03-31 MED ORDER — METHOCARBAMOL 500 MG PO TABS: 500.0000 mg | ORAL_TABLET | Freq: Two times a day (BID) | ORAL | Status: DC

## 2011-03-31 MED ORDER — IBUPROFEN 600 MG PO TABS: 600.0000 mg | ORAL_TABLET | Freq: Four times a day (QID) | ORAL | Status: AC | PRN

## 2011-03-31 MED ORDER — OXYCODONE-ACETAMINOPHEN 5-325 MG PO TABS
1.0000 | ORAL_TABLET | Freq: Once | ORAL | Status: AC
  Administered 2011-03-31: 1 via ORAL
  Filled 2011-03-31: qty 1

## 2011-03-31 MED ORDER — OXYCODONE-ACETAMINOPHEN 5-325 MG PO TABS: 2.0000 | ORAL_TABLET | ORAL | Status: AC | PRN

## 2011-03-31 MED ORDER — METHOCARBAMOL 500 MG PO TABS: 500.0000 mg | ORAL_TABLET | Freq: Two times a day (BID) | ORAL | Status: AC

## 2011-03-31 NOTE — ED Notes (Signed)
Pt reports that last PM her son jumped on her left knee while she was lying flat. Pt report hearing a "pop" and feeling like her knee slipped

## 2011-03-31 NOTE — ED Notes (Signed)
Pt has full sensation to left knee, no obvious signs of trauma.

## 2011-03-31 NOTE — ED Provider Notes (Signed)
History     Chief Complaint  Patient presents with  . Knee Pain   Patient is a 40 y.o. female presenting with knee pain. The history is provided by the patient.  Knee Pain This is a new problem. The current episode started yesterday. The problem occurs constantly. The symptoms are aggravated by bending, twisting and standing. The symptoms are relieved by ice.   Knee injured by direct trauma Past Medical History  Diagnosis Date  . Depression     History reviewed. No pertinent past surgical history.  History reviewed. No pertinent family history.  History  Substance Use Topics  . Smoking status: Never Smoker   . Smokeless tobacco: Not on file  . Alcohol Use: No    OB History    Grav Para Term Preterm Abortions TAB SAB Ect Mult Living                  Review of Systems  All other systems reviewed and are negative.    Physical Exam  BP 113/67  Pulse 66  Temp(Src) 98 F (36.7 C) (Oral)  Resp 16  SpO2 99%  LMP 01/26/2011  Physical Exam  Nursing note and vitals reviewed. Constitutional: She is oriented to person, place, and time. She appears well-developed and well-nourished.  Non-toxic appearance.  HENT:  Head: Normocephalic and atraumatic.  Eyes: Conjunctivae are normal. Pupils are equal, round, and reactive to light.  Neck: Normal range of motion.  Cardiovascular: Normal rate.   Pulmonary/Chest: Effort normal.  Musculoskeletal:       Left knee: She exhibits no swelling, no effusion, no deformity, no LCL laxity, normal patellar mobility and no MCL laxity. tenderness found.       Legs: Neurological: She is alert and oriented to person, place, and time.  Skin: Skin is warm and dry.  Psychiatric: She has a normal mood and affect.    ED Course  Procedures  MDM xrays neg, pain meds given, will place in knee immolbilzer and give orth referral    Toy Baker, MD 03/31/11 1249

## 2011-04-15 ENCOUNTER — Ambulatory Visit (INDEPENDENT_AMBULATORY_CARE_PROVIDER_SITE_OTHER): Payer: 59 | Admitting: Family Medicine

## 2011-04-15 VITALS — BP 118/83

## 2011-04-15 DIAGNOSIS — M259 Joint disorder, unspecified: Secondary | ICD-10-CM

## 2011-04-15 DIAGNOSIS — M222X2 Patellofemoral disorders, left knee: Secondary | ICD-10-CM

## 2011-04-16 NOTE — Progress Notes (Signed)
  Subjective:    Patient ID: Stacey Hickman, female    DOB: [birth year], 40 y.o.   MRN: [id number]  HPI  His tetanus is 40 years old to the patient come in today complaining of the knee pain started 2 weeks ago after her son jumped and landed on her left knee. she never had issues with this knee before. She was seen today is later in urgent care and x-rays of her knee were done which were negative. She was placed on knee immobilizer and recommended to take multiple pain control. She states that her symptoms have improved about 60%. She is able to move her knee without much discomfort but she still has a mild pain in the medial aspect of her knee cap, 3/10 intensity, no radiated, improved by rest, worsening with activities . She denies any mechanical symptoms as locking catching or giving away. It on and off popping coming from her attorney. No swelling.   Review of Systems  Constitutional: Negative for fever, chills, diaphoresis and fatigue.  Musculoskeletal:       Left knee pain with history of present illness       Objective:   Physical Exam  Constitutional: She appears well-developed and well-nourished.       BP 118/83  LMP 01/26/2011   Eyes: EOM are normal.  Pulmonary/Chest: Effort normal.  Musculoskeletal:       Left knee with intact skin, FROM. Patellofemoral crepitus present with flexion and extension. Patellofemoral compression  test +.Mild tenderness on the quad insertion in the patella. No TTP in the patellar tendon. Ligaments intact. Lachman neg. Varus and valgus test at 0 and 30 degres neg Normal gait without a limp. Feet with mid arch.         Assessment & Plan:    1. Patellofemoral disorder of left knee    She is 60% better. No instability on her knee. Recommend to continue Motrin 600 mg 3 times a day Recommend quad strengthening exercise  Followup in 3 weeks if not better

## 2011-05-06 ENCOUNTER — Ambulatory Visit: Payer: 59 | Admitting: Family Medicine

## 2011-05-11 ENCOUNTER — Other Ambulatory Visit: Payer: Self-pay | Admitting: Family Medicine

## 2011-05-11 NOTE — Telephone Encounter (Signed)
Please advise on quantity change     KP

## 2011-05-12 MED ORDER — FLUOXETINE HCL 20 MG PO CAPS: 60.0000 mg | ORAL_CAPSULE | Freq: Every day | ORAL | Status: DC

## 2011-05-12 NOTE — Telephone Encounter (Signed)
Faxed.   KP 

## 2011-05-12 NOTE — Telephone Encounter (Signed)
Yes. That's fine.  

## 2011-06-10 LAB — CBC
HCT: 47 — ABNORMAL HIGH
Hemoglobin: 16.3 — ABNORMAL HIGH
MCHC: 34.5
Platelets: 295
RDW: 11.9

## 2011-06-14 ENCOUNTER — Encounter: Payer: 59 | Admitting: Family Medicine

## 2011-06-21 LAB — CBC
HCT: 36
MCHC: 35
MCV: 90.1
Platelets: 144 — ABNORMAL LOW
Platelets: 165
RBC: 4.29
RDW: 13.7
WBC: 12.8 — ABNORMAL HIGH
WBC: 13.4 — ABNORMAL HIGH

## 2011-06-21 LAB — RH IMMUNE GLOB WKUP(>/=20WKS)(NOT WOMEN'S HOSP): Fetal Screen: NEGATIVE

## 2011-06-21 LAB — RPR: RPR Ser Ql: NONREACTIVE

## 2011-06-21 LAB — CCBB MATERNAL DONOR DRAW

## 2011-06-24 LAB — RH IMMUNE GLOBULIN WORKUP (NOT WOMEN'S HOSP)

## 2011-06-29 LAB — RH IMMUNE GLOBULIN WORKUP (NOT WOMEN'S HOSP)
ABO/RH(D): A NEG
ABO/RH(D): A NEG
Antibody Screen: POSITIVE

## 2011-06-29 LAB — TISSUE HYBRIDIZATION TO NCBH

## 2011-06-30 ENCOUNTER — Encounter: Payer: Self-pay | Admitting: Family Medicine

## 2011-07-01 ENCOUNTER — Encounter: Payer: Self-pay | Admitting: Family Medicine

## 2011-07-01 ENCOUNTER — Ambulatory Visit (INDEPENDENT_AMBULATORY_CARE_PROVIDER_SITE_OTHER): Payer: 59 | Admitting: Family Medicine

## 2011-07-01 VITALS — BP 120/86 | HR 82 | Temp 98.8°F | Ht 63.5 in | Wt 186.0 lb

## 2011-07-01 DIAGNOSIS — Z0000 Encounter for general adult medical examination without abnormal findings: Secondary | ICD-10-CM

## 2011-07-01 LAB — POCT URINALYSIS DIPSTICK
Blood, UA: NEGATIVE
Protein, UA: NEGATIVE
Spec Grav, UA: 1.02
Urobilinogen, UA: 0.2

## 2011-07-01 LAB — T4, FREE: Free T4: 0.78 ng/dL (ref 0.60–1.60)

## 2011-07-01 LAB — HEPATIC FUNCTION PANEL
ALT: 27 U/L (ref 0–35)
AST: 27 U/L (ref 0–37)
Bilirubin, Direct: 0.1 mg/dL (ref 0.0–0.3)
Total Bilirubin: 0.8 mg/dL (ref 0.3–1.2)

## 2011-07-01 LAB — BASIC METABOLIC PANEL
BUN: 15 mg/dL (ref 6–23)
Chloride: 106 mEq/L (ref 96–112)
Potassium: 3.6 mEq/L (ref 3.5–5.1)

## 2011-07-01 LAB — LIPID PANEL
Cholesterol: 162 mg/dL (ref 0–200)
LDL Cholesterol: 96 mg/dL (ref 0–99)

## 2011-07-01 LAB — CBC WITH DIFFERENTIAL/PLATELET
Eosinophils Absolute: 0 10*3/uL (ref 0.0–0.7)
MCHC: 34.2 g/dL (ref 30.0–36.0)
MCV: 92 fl (ref 78.0–100.0)
Monocytes Absolute: 0.4 10*3/uL (ref 0.1–1.0)
Neutrophils Relative %: 74.4 % (ref 43.0–77.0)
Platelets: 229 10*3/uL (ref 150.0–400.0)

## 2011-07-01 LAB — TSH: TSH: 3.74 u[IU]/mL (ref 0.35–5.50)

## 2011-07-01 NOTE — Progress Notes (Signed)
Subjective:     Stacey Hickman is a 40 y.o. female and is here for a comprehensive physical exam. The patient reports problems - increased Migraines--she used to have them with cycle and starting in March they got worse with weather. She was getting them 1-2 x a week and now they have decreased to 2x a month..  History   Social History  . Marital Status: Married    Spouse Name: N/A    Number of Children: N/A  . Years of Education: N/A   Occupational History  . Not on file.   Social History Main Topics  . Smoking status: Never Smoker   . Smokeless tobacco: Not on file  . Alcohol Use: No  . Drug Use: No  . Sexually Active: Yes   Other Topics Concern  . Not on file   Social History Narrative  . No narrative on file   Health Maintenance  Topic Date Due  . Pap Smear  12/03/1988  . Tetanus/tdap  12/03/1989  . Influenza Vaccine  06/14/2011    The following portions of the patient's history were reviewed and updated as appropriate: allergies, current medications, past family history, past medical history, past social history, past surgical history and problem list.  Review of Systems Review of Systems  Constitutional: Negative for activity change, appetite change and fatigue.  HENT: Negative for hearing loss, congestion, tinnitus and ear discharge.  dentist q45m Eyes: Negative for visual disturbance (see optho q1y -- vision corrected to 20/20 with glasses).  Respiratory: Negative for cough, chest tightness and shortness of breath.   Cardiovascular: Negative for chest pain, palpitations and leg swelling.  Gastrointestinal: Negative for abdominal pain, diarrhea, constipation and abdominal distention.  Genitourinary: Negative for urgency, frequency, decreased urine volume and difficulty urinating.  Musculoskeletal: Negative for back pain, arthralgias and gait problem.  Skin: Negative for color change, pallor and rash.  Neurological: Negative for dizziness, light-headedness, numbness  and headaches.  Hematological: Negative for adenopathy. Does not bruise/bleed easily.  Psychiatric/Behavioral: Negative for suicidal ideas, confusion, sleep disturbance, self-injury, dysphoric mood, decreased concentration and agitation.       Objective:     BP 120/86  Pulse 82  Temp(Src) 98.8 F (37.1 C) (Oral)  Ht 5' 3.5" (1.613 m)  Wt 186 lb (84.369 kg)  BMI 32.43 kg/m2  SpO2 96%  General Appearance:    Alert, cooperative, no distress, appears stated age  Head:    Normocephalic, without obvious abnormality, atraumatic  Eyes:    PERRL, conjunctiva/corneas clear, EOM's intact, fundi    benign, both eyes  Ears:    Normal TM's and external ear canals, both ears  Nose:   Nares normal, septum midline, mucosa normal, no drainage    or sinus tenderness  Throat:   Lips, mucosa, and tongue normal; teeth and gums normal  Neck:   Supple, symmetrical, trachea midline, no adenopathy;    thyroid:  no enlargement/tenderness/nodules; no carotid   bruit or JVD  Back:     Symmetric, no curvature, ROM normal, no CVA tenderness  Lungs:     Clear to auscultation bilaterally, respirations unlabored  Chest Wall:    No tenderness or deformity   Heart:    Regular rate and rhythm, S1 and S2 normal, no murmur, rub   or gallop  Breast Exam:    gyn  Abdomen:     Soft, non-tender, bowel sounds active all four quadrants,    no masses, no organomegaly  Genitalia:    gyn  Rectal:  gyn  Extremities:   Extremities normal, atraumatic, no cyanosis or edema  Pulses:   2+ and symmetric all extremities  Skin:   Skin color, texture, turgor normal, no rashes or lesions  Lymph nodes:   Cervical, supraclavicular, and axillary nodes normal  Neurologic:   CNII-XII intact, normal strength, sensation and reflexes    throughout     Assessment:    Healthy female exam.    Migraines---Immitrex 100 mg  #10 Depression--stable ,  con't meds   Plan:    ghm utd Check labs Pap per gyn See After Visit Summary for  Counseling Recommendations

## 2011-07-01 NOTE — Patient Instructions (Signed)

## 2011-07-20 ENCOUNTER — Encounter: Payer: Self-pay | Admitting: *Deleted

## 2011-07-20 ENCOUNTER — Encounter: Payer: 59 | Attending: Family Medicine | Admitting: *Deleted

## 2011-07-20 DIAGNOSIS — E669 Obesity, unspecified: Secondary | ICD-10-CM | POA: Insufficient documentation

## 2011-07-20 DIAGNOSIS — Z713 Dietary counseling and surveillance: Secondary | ICD-10-CM | POA: Insufficient documentation

## 2011-07-20 NOTE — Progress Notes (Addendum)
Medical Nutrition Therapy:  Appt start time: 1600 end time:  1700.  Assessment:  Obesity/Weight Loss.  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 Reports 15-20 lb weight gain in last year d/t injuries preventing exercise and by "not eating well". Loves sweets; food recall shows excessive CHO intake as well as meals away from home.  Pt states she plans to resume exercising now that knee has healed.  Pt reports no pain at this time.  MEDICATIONS: See med list; reconciled with patient   DIETARY INTAKE:  Usual eating pattern includes 3 meals and 1 snacks per day.  24-hr recall:  B ( AM): Oatmeal (McD's); 8 oz sweet tea (drank 1/2) Snk ( AM): none  L ( PM): [employer] - [place] sandwich on wheat, little mayo, no cheese w/ a chocolate chip cookie; 8 oz Propel water Snk ( PM): hot chocolate (1 reg pkt) D ( PM): (3) 6" fajitas (chicken, tom, green peppers, lettuce, salsa, rice); Propel Snk ( PM): none  Usual physical activity: None  Estimated energy needs: 1100-1200 calories 135-150 g carbohydrates 70-75 g protein 30-35 g fat  Progress Towards Goal(s):  In progress.   Nutritional Diagnosis:  [name]-3.3 Obesity and [name]-3.4 Unintentional weight gain related to excessive CHO intake and a period of no physical activity as evidenced by pt reported food intake, weight gain of 15-20 lbs in 12 months, and a BMI of 33.0 kg/m^2.    Intervention/Goals:   Increase protein rich foods to all meals and snacks.  Limit carbohydrate to 45 g/meal and one 15 g/snack (see yellow card).  Choose more whole grains, lean protein, low-fat dairy, and fruits/non-starchy vegetables (MyPlate)  Aim for >30-60 min of physical activity 3 times/week.  Limit sugar-sweetened beverages and concentrated sweets.  Limit sodium to <2400 mg/day.   Monitoring/Evaluation:  Dietary intake, exercise, and body weight in 4 week(s).  Samples Dispensed:  CIB Packet (reg):  4 ea Exp: 04/26/12  CIB Packet (NAS): 3  ea Exp: 03/09/12  Boost Calorie Smart (8 oz): 2 ea Exp: 02/03/12  Boost Kid Essentials (8.25 oz): 4 ea Exp: 03/10/12  Chart purged - 07/20/11

## 2011-07-20 NOTE — Patient Instructions (Addendum)
Goals:   Increase protein rich foods to all meals and snacks.  Limit carbohydrate to 45 g/meal and one 15 g/snack (see yellow card).  Choose more whole grains, lean protein, low-fat dairy, and fruits/non-starchy vegetables (MyPlate)  Aim for >30-60 min of physical activity 3 times/week.  Limit sugar-sweetened beverages and concentrated sweets.  Limit sodium to <2400 mg/day.  *Check out www.calorieking.com for food label info.

## 2011-08-16 ENCOUNTER — Encounter: Payer: 59 | Attending: Family Medicine | Admitting: *Deleted

## 2011-08-16 ENCOUNTER — Encounter: Payer: Self-pay | Admitting: *Deleted

## 2011-08-16 DIAGNOSIS — E669 Obesity, unspecified: Secondary | ICD-10-CM | POA: Insufficient documentation

## 2011-08-16 DIAGNOSIS — Z713 Dietary counseling and surveillance: Secondary | ICD-10-CM | POA: Insufficient documentation

## 2011-08-16 NOTE — Progress Notes (Signed)
Medical Nutrition Therapy:  Appt start time: 1630 end time:  1700.  Assessment:  Obesity/Weight Loss.  Pt here for follow up and has lost 3 lbs. States she is more aware of dietary intake and portion sizes; noticed she is not getting all her protein in.  Tracking certain items, but not all at this time. Continues to eat out daily for lunch and some dinners, but making better choices.  Has decreased sweet tea intake and drinking more water.  Reports some exercise, but not consistent yet.  Pt's goal for this month is to add in more exercise (Zumba classes at [facility]).   MEDICATIONS: See med list; no changes reported.   DIETARY INTAKE:  Usual eating pattern includes 3 meals and 1 snacks per day.  Partial 24-hr recall:  B ( AM): Oatmeal (McD's or at home ); water Snk ( AM): none  L ( PM): still eating out - making better choices (no chips or sweet tea; water instead) Snk ( PM): laughing cow cheese w/ special K chips   Usual physical activity: Very little per pt.  Estimated energy needs: 1100-1200 calories 135-150 g carbohydrates 70-75 g protein 30-35 g fat  Progress Towards Goal(s):  Progress noted; continue.   Nutritional Diagnosis:  [place]-3.3 Obesity and [place]-3.4 Unintentional weight gain related to excessive CHO intake and a period of no physical activity as evidenced by pt reported food intake, weight gain of 15-20 lbs in 12 months, and a BMI of 33.0 kg/m^2.    Intervention/Goals:   Continue previous goals.  Aim to add more exercise.   Monitoring/Evaluation:  Dietary intake, exercise, and body weight in 5 week(s).   Chart purged - 07/20/11

## 2011-08-16 NOTE — Patient Instructions (Addendum)
Goals: - Continue previous goals. - Aim to add more exercise.

## 2011-09-21 ENCOUNTER — Encounter: Payer: 59 | Attending: Family Medicine | Admitting: *Deleted

## 2011-09-21 DIAGNOSIS — Z713 Dietary counseling and surveillance: Secondary | ICD-10-CM | POA: Insufficient documentation

## 2011-09-21 DIAGNOSIS — E669 Obesity, unspecified: Secondary | ICD-10-CM | POA: Insufficient documentation

## 2011-09-21 NOTE — Patient Instructions (Signed)
Goals: - Continue previous goals. - Aim to add more exercise.   

## 2011-09-21 NOTE — Progress Notes (Signed)
Medical Nutrition Therapy:  Appt start time: 1630 end time:  1700  Primary concerns today:  Obesity/Weight Loss.  Pt here for follow up and has maintained weight loss. Continues to eat out daily for lunch, but has decreased dinners out and continues to make better choices.  Had decreased sweet tea intake until recently.  Reports minimal exercise over holidays.   MEDICATIONS: See med list; pt reports not taking tramadol, nitroglycerine, fiber, or etonogestrel.   DIETARY INTAKE:  Usual eating pattern includes 3 meals and 1 snacks per day.  Partial 24-hr recall:  B ( AM): Egg & cheese breakfast sandwich (Special K); water Snk ( AM): none  L ( PM): still eating out - making better choices (sweet tea and sweets this weekend) Snk ( PM): laughing cow cheese w/ special K chips  Usual physical activity: Very little per pt.  Estimated energy needs: 1100-1200 calories 135-150 g carbohydrates 70-75 g protein 30-35 g fat  Progress Towards Goal(s):  Progress noted; continue.   Nutritional Diagnosis:  [place]-3.3 Obesity and [place]-3.4 Unintentional weight gain related to excessive CHO intake and a period of no physical activity as evidenced by pt reported food intake, weight gain of 15-20 lbs in 12 months, and a BMI of 33.0 kg/m^2.    Intervention/Goals:   Continue previous goals.  Aim to add more exercise.   Monitoring/Evaluation:  Dietary intake, exercise, and body weight in 6 week(s).

## 2011-09-23 ENCOUNTER — Encounter: Payer: Self-pay | Admitting: *Deleted

## 2011-11-02 ENCOUNTER — Ambulatory Visit: Payer: 59 | Admitting: *Deleted

## 2011-11-15 ENCOUNTER — Ambulatory Visit: Payer: 59 | Admitting: *Deleted

## 2011-12-21 ENCOUNTER — Other Ambulatory Visit: Payer: Self-pay | Admitting: Family Medicine

## 2011-12-21 DIAGNOSIS — Z1231 Encounter for screening mammogram for malignant neoplasm of breast: Secondary | ICD-10-CM

## 2011-12-28 ENCOUNTER — Ambulatory Visit (INDEPENDENT_AMBULATORY_CARE_PROVIDER_SITE_OTHER): Payer: 59 | Admitting: Internal Medicine

## 2011-12-28 ENCOUNTER — Encounter: Payer: Self-pay | Admitting: Internal Medicine

## 2011-12-28 VITALS — BP 124/80 | HR 91 | Temp 98.4°F | Wt 184.0 lb

## 2011-12-28 DIAGNOSIS — J029 Acute pharyngitis, unspecified: Secondary | ICD-10-CM

## 2011-12-28 MED ORDER — AMOXICILLIN 500 MG PO CAPS: 1000.0000 mg | ORAL_CAPSULE | Freq: Two times a day (BID) | ORAL | Status: AC

## 2011-12-28 NOTE — Progress Notes (Signed)
  Subjective:    Patient ID: Stacey Hickman, female    DOB: 1971-06-08, 41 y.o.   MRN: [id number]  HPI Acute visit. Woke up at 4 AM this morning with sore throat and felt her  throat was swollen. Typically, this is a sign of strep infection on her. She did have some chills and aches but no fever.  Past medical history: Reviewed.  Review of Systems No runny nose, cough or chest congestion. Denies any sinus pain or discharge but she did have some left ear pressure Not on birthcontrol, last period about a month ago.    Objective:   Physical Exam General -- alert, well-developed, and well-nourished. NAD  Neck - B submandibular LADs. slt tender. HEENT -- TMs normal, throat w/ mild redness, tonsils normal size, no d/c, face symmetric and not tender to palpation  Lungs -- normal respiratory effort, no intercostal retractions, no accessory muscle use, and normal breath sounds.   Heart-- normal rate, regular rhythm, no murmur, and no gallop.   extremities-- no pretibial edema bilaterally      Assessment & Plan:

## 2011-12-28 NOTE — Patient Instructions (Signed)
Rest, fluids , tylenol Take the antibiotic as prescribed  (Amoxicillin) Call if no better in few days Call anytime if the symptoms are severe

## 2011-12-28 NOTE — Assessment & Plan Note (Addendum)
Presents w/ acute sore throat, throat is slightly erythematous Plan: Amoxicillin, check a throat culture, if negative will discontinue antibiotics. See instructions.

## 2011-12-30 LAB — CULTURE, GROUP A STREP

## 2012-01-18 ENCOUNTER — Ambulatory Visit
Admission: RE | Admit: 2012-01-18 | Discharge: 2012-01-18 | Disposition: A | Discharge status: Home / Self Care | Payer: 59 | Source: Ambulatory Visit | Attending: Family Medicine | Admitting: Family Medicine

## 2012-01-18 DIAGNOSIS — Z1231 Encounter for screening mammogram for malignant neoplasm of breast: Secondary | ICD-10-CM

## 2012-02-01 ENCOUNTER — Ambulatory Visit (INDEPENDENT_AMBULATORY_CARE_PROVIDER_SITE_OTHER): Payer: 59 | Admitting: Family Medicine

## 2012-02-01 ENCOUNTER — Encounter: Payer: Self-pay | Admitting: Family Medicine

## 2012-02-01 VITALS — BP 130/74 | HR 71 | Temp 98.1°F | Wt 185.2 lb

## 2012-02-01 DIAGNOSIS — J029 Acute pharyngitis, unspecified: Secondary | ICD-10-CM

## 2012-02-01 DIAGNOSIS — J020 Streptococcal pharyngitis: Secondary | ICD-10-CM

## 2012-02-01 MED ORDER — PENICILLIN G BENZATHINE 1200000 UNIT/2ML IM SUSP
1.2000 10*6.[IU] | Freq: Once | INTRAMUSCULAR | Status: AC
  Administered 2012-02-01: 1.2 10*6.[IU] via INTRAMUSCULAR

## 2012-02-01 NOTE — Patient Instructions (Signed)

## 2012-02-01 NOTE — Progress Notes (Signed)
  Subjective:     Stacey Hickman is a 41 y.o. female who presents for evaluation of sore throat. Associated symptoms include post nasal drip, sore throat and swollen glands. Onset of symptoms was 4 days ago, and have been gradually worsening since that time. She is drinking plenty of fluids. She has not had a recent close exposure to someone with proven streptococcal pharyngitis.  The following portions of the patient's history were reviewed and updated as appropriate: allergies, current medications, past family history, past medical history, past social history, past surgical history and problem list.  Review of Systems Pertinent items are noted in HPI.    Objective:    BP 130/74  Pulse 71  Temp(Src) 98.1 F (36.7 C) (Oral)  Wt 185 lb 3.2 oz (84.006 kg)  SpO2 96%  LMP 11/19/2011 General appearance: alert, cooperative, appears stated age and no distress Ears: normal TM's and external ear canals both ears Nose: Nares normal. Septum midline. Mucosa normal. No drainage or sinus tenderness. Throat: abnormal findings: moderate oropharyngeal erythema Neck: mild anterior cervical adenopathy and thyroid not enlarged, symmetric, no tenderness/mass/nodules Lungs: clear to auscultation bilaterally  Laboratory Strep test done. Results:positive.    Assessment:    Acute pharyngitis, likely  Strep throat.    Plan:    Patient placed on antibiotics. Use of OTC analgesics recommended as well as salt water gargles. Patient advised that he will be infectious for 24 hours after starting antibiotics. Follow up as needed.

## 2012-02-01 NOTE — Progress Notes (Deleted)
  Subjective:     Stacey Hickman is a 41 y.o. female who presents for evaluation of symptoms of a URI. Symptoms include no  fever, post nasal drip and sore throat. Onset of symptoms was 4 days ago, and has been gradually worsening since that time. Treatment to date: antihistamines.  The following portions of the patient's history were reviewed and updated as appropriate: allergies, current medications, past family history, past medical history, past social history, past surgical history and problem list.  Review of Systems Pertinent items are noted in HPI.   Objective:    BP 130/74  Pulse 71  Temp(Src) 98.1 F (36.7 C) (Oral)  Wt 185 lb 3.2 oz (84.006 kg)  SpO2 96%  LMP 11/19/2011 General appearance: alert, cooperative, appears stated age and no distress Ears: {ear exam:5207::"normal TM's and external ear canals both ears"} Nose: {nose exam:13771::"Nares normal. Septum midline. Mucosa normal. No drainage or sinus tenderness."} Throat: {throat exam:17160::"lips, mucosa, and tongue normal; teeth and gums normal"} Neck: {neck exam:17463::"no adenopathy","no carotid bruit","no JVD","supple, symmetrical, trachea midline","thyroid not enlarged, symmetric, no tenderness/mass/nodules"} Lungs: {lung exam:16931} Lymph nodes: {lymph node exam:14039::"Cervical, supraclavicular, and axillary nodes normal."}   Assessment:    {uri dx:5273::"viral upper respiratory illness"}   Plan:    {plan; uri:14054::"Discussed diagnosis and treatment of URI.","Suggested symptomatic OTC remedies.","Nasal saline spray for congestion.","Follow up as needed."}  Subjective:     Stacey Hickman is a 41 y.o. female who presents for evaluation of sore throat. Associated symptoms include post nasal drip, sore throat and swollen glands. Onset of symptoms was 4 days ago, and have been gradually worsening since that time. She is drinking plenty of fluids. She has not had a recent close exposure to someone with proven  streptococcal pharyngitis.  The following portions of the patient's history were reviewed and updated as appropriate: allergies, current medications, past family history, past medical history, past social history, past surgical history and problem list.  Review of Systems Pertinent items are noted in HPI.    Objective:    BP 130/74  Pulse 71  Temp(Src) 98.1 F (36.7 C) (Oral)  Wt 185 lb 3.2 oz (84.006 kg)  SpO2 96%  LMP 11/19/2011 General appearance: alert, cooperative, appears stated age and no distress Ears: normal TM's and external ear canals both ears Nose: Nares normal. Septum midline. Mucosa normal. No drainage or sinus tenderness. Throat: abnormal findings: moderate oropharyngeal erythema Neck: mild anterior cervical adenopathy and thyroid not enlarged, symmetric, no tenderness/mass/nodules Lungs: clear to auscultation bilaterally  Laboratory Strep test done. Results:positive.    Assessment:    Acute pharyngitis, likely  Strep throat.    Plan:    Patient placed on antibiotics. Use of OTC analgesics recommended as well as salt water gargles. Patient advised that he will be infectious for 24 hours after starting antibiotics. Follow up as needed.

## 2012-03-06 ENCOUNTER — Telehealth: Payer: Self-pay | Admitting: Family Medicine

## 2012-03-06 ENCOUNTER — Encounter: Payer: Self-pay | Admitting: Family Medicine

## 2012-03-06 ENCOUNTER — Ambulatory Visit (INDEPENDENT_AMBULATORY_CARE_PROVIDER_SITE_OTHER): Payer: 59 | Admitting: Family Medicine

## 2012-03-06 VITALS — BP 104/60 | HR 83 | Temp 98.6°F | Wt 187.6 lb

## 2012-03-06 DIAGNOSIS — M549 Dorsalgia, unspecified: Secondary | ICD-10-CM

## 2012-03-06 MED ORDER — HYDROCODONE-ACETAMINOPHEN 5-500 MG PO TABS: 1.0000 | ORAL_TABLET | Freq: Three times a day (TID) | ORAL | Status: AC | PRN

## 2012-03-06 MED ORDER — CYCLOBENZAPRINE HCL 10 MG PO TABS: 10.0000 mg | ORAL_TABLET | Freq: Three times a day (TID) | ORAL | Status: AC | PRN

## 2012-03-06 NOTE — Telephone Encounter (Signed)
Caller: Kimberlly/Patient; PCP: Lelon Perla.; CB#: [phone];  Call regarding Back Pain; sx started 03/06/12; pain is located in lower back and radiating down right leg and front of the pelvic area; she states that she was picking up her daughter and felt something pull; Triaged per Back Symptoms Guideline; See in 24 hr d/t   back pain radiates into thigh; appt made for today at 10:30am Dr Laury Axon; will comply

## 2012-03-06 NOTE — Telephone Encounter (Signed)
Pt seen today

## 2012-03-06 NOTE — Patient Instructions (Signed)
Back Exercises Back exercises help treat and prevent back injuries. The goal of back exercises is to increase the strength of your abdominal and back muscles and the flexibility of your back. These exercises should be started when you no longer have back pain. Back exercises include:  Pelvic Tilt. Lie on your back with your knees bent. Tilt your pelvis until the lower part of your back is against the floor. Hold this position 5 to 10 sec and repeat 5 to 10 times.   Knee to Chest. Pull first 1 knee up against your chest and hold for 20 to 30 seconds, repeat this with the other knee, and then both knees. This may be done with the other leg straight or bent, whichever feels better.   Sit-Ups or Curl-Ups. Bend your knees 90 degrees. Start with tilting your pelvis, and do a partial, slow sit-up, lifting your trunk only 30 to 45 degrees off the floor. Take at least 2 to 3 seconds for each sit-up. Do not do sit-ups with your knees out straight. If partial sit-ups are difficult, simply do the above but with only tightening your abdominal muscles and holding it as directed.   Hip-Lift. Lie on your back with your knees flexed 90 degrees. Push down with your feet and shoulders as you raise your hips a couple inches off the floor; hold for 10 seconds, repeat 5 to 10 times.   Back arches. Lie on your stomach, propping yourself up on bent elbows. Slowly press on your hands, causing an arch in your low back. Repeat 3 to 5 times. Any initial stiffness and discomfort should lessen with repetition over time.   Shoulder-Lifts. Lie face down with arms beside your body. Keep hips and torso pressed to floor as you slowly lift your head and shoulders off the floor.  Do not overdo your exercises, especially in the beginning. Exercises may cause you some mild back discomfort which lasts for a few minutes; however, if the pain is more severe, or lasts for more than 15 minutes, do not continue exercises until you see your  caregiver. Improvement with exercise therapy for back problems is slow.  See your caregivers for assistance with developing a proper back exercise program. Document Released: 10/07/2004 Document Revised: 08/19/2011 Document Reviewed: 08/30/2005 [employer] Patient Information 2012 [employer]. 

## 2012-03-06 NOTE — Progress Notes (Signed)
  Subjective:    Stacey Hickman is a 41 y.o. female who presents for evaluation of low back pain. The patient has had no prior back problems. Symptoms have been present for 1 day and are gradually worsening.  Onset was related to / precipitated by lifting a heavy object. The pain is located in the across the lower back and radiates to the right thigh, left thigh. The pain is described as aching and burning and occurs all day. She rates her pain as severe. Symptoms are exacerbated by extension, flexion, lifting, lying down, sitting and standing. Symptoms are improved by nothing. She has also tried nothing which provided no symptom relief. She has no other symptoms associated with the back pain. The patient has no "red flag" history indicative of complicated back pain.  The following portions of the patient's history were reviewed and updated as appropriate: allergies, current medications, past family history, past medical history, past social history, past surgical history and problem list.  Review of Systems Pertinent items are noted in HPI.    Objective:   Full range of motion without pain, no tenderness, no spasm, no curvature. Normal reflexes, gait, strength and negative straight-leg raise.    Assessment:    Nonspecific acute low back pain    Plan:    Natural history and expected course discussed. Questions answered. Agricultural engineer distributed. Stretching exercises discussed. Short (2-4 day) period of relative rest recommended until acute symptoms improve. Ice to affected area as needed for local pain relief. Heat to affected area as needed for local pain relief. Muscle relaxants per medication orders. Follow-up in 1 week. --or sooner prn

## 2012-03-13 ENCOUNTER — Telehealth: Payer: Self-pay | Admitting: *Deleted

## 2012-03-13 DIAGNOSIS — M549 Dorsalgia, unspecified: Secondary | ICD-10-CM

## 2012-03-13 NOTE — Telephone Encounter (Signed)
msg left to call the office     KP 

## 2012-03-13 NOTE — Telephone Encounter (Signed)
LS xray----is pain still radiating down to thigh? If no we can try PT----if yes may need MRI.  Insurance will want xrays first and they may make [place] try PT first before MRI.

## 2012-03-13 NOTE — Telephone Encounter (Signed)
Pt left VM that back pain is still no better and would like to know what is the next step. Pt seen on 03-06-12.Please advise

## 2012-03-14 NOTE — Telephone Encounter (Signed)
discussed with patient and she voiced understanding, she will go ahead and get the LS xray done and consider PT if still having the pain. Order put in and patient will have it done at [facility].     KP

## 2012-03-15 ENCOUNTER — Telehealth: Payer: Self-pay

## 2012-03-15 ENCOUNTER — Ambulatory Visit (HOSPITAL_COMMUNITY)
Admission: RE | Admit: 2012-03-15 | Discharge: 2012-03-15 | Disposition: A | Discharge status: Home / Self Care | Payer: 59 | Source: Ambulatory Visit | Attending: Family Medicine | Admitting: Family Medicine

## 2012-03-15 DIAGNOSIS — M549 Dorsalgia, unspecified: Secondary | ICD-10-CM

## 2012-03-15 NOTE — Telephone Encounter (Signed)
Message copied by Arnette Norris on Wed Mar 15, 2012 10:47 AM ------      Message from: Lelon Perla      Created: Wed Mar 15, 2012 10:10 AM       Neg xray-- we can refer to PT or if pain worse or no better at all send to ortho

## 2012-03-15 NOTE — Telephone Encounter (Signed)
Discussed with patient and she agreed to get PT. Order put in.      KP

## 2012-04-03 ENCOUNTER — Ambulatory Visit: Payer: 59 | Attending: Family Medicine | Admitting: Rehabilitation

## 2012-04-03 DIAGNOSIS — IMO0001 Reserved for inherently not codable concepts without codable children: Secondary | ICD-10-CM | POA: Insufficient documentation

## 2012-04-03 DIAGNOSIS — M545 Low back pain, unspecified: Secondary | ICD-10-CM | POA: Insufficient documentation

## 2012-04-06 ENCOUNTER — Ambulatory Visit: Payer: 59 | Admitting: Rehabilitation

## 2012-04-10 ENCOUNTER — Ambulatory Visit: Payer: 59 | Admitting: Rehabilitation

## 2012-04-13 ENCOUNTER — Ambulatory Visit: Payer: 59 | Attending: Family Medicine | Admitting: Rehabilitation

## 2012-04-13 DIAGNOSIS — M545 Low back pain, unspecified: Secondary | ICD-10-CM | POA: Insufficient documentation

## 2012-04-13 DIAGNOSIS — IMO0001 Reserved for inherently not codable concepts without codable children: Secondary | ICD-10-CM | POA: Insufficient documentation

## 2012-04-17 ENCOUNTER — Ambulatory Visit: Payer: 59 | Admitting: Rehabilitation

## 2012-04-20 ENCOUNTER — Encounter: Payer: 59 | Admitting: Rehabilitation

## 2012-04-21 ENCOUNTER — Other Ambulatory Visit: Payer: Self-pay | Admitting: Family Medicine

## 2012-04-21 NOTE — Telephone Encounter (Signed)
Noted refill sent via escribe per MD Lowne 

## 2012-04-21 NOTE — Telephone Encounter (Signed)
Last OV 03-06-12 however last refill noted with historical provider, never filled by MD Laury Axon, please advise

## 2012-08-01 IMAGING — CR DG LUMBAR SPINE COMPLETE 4+V
5 series · 5 of 5 positions shown · non-contrast
Comparison: 10/28/2008

CLINICAL DATA: Back pain

LUMBAR SPINE - COMPLETE 4+ VIEW

[t l-spine a.p.]
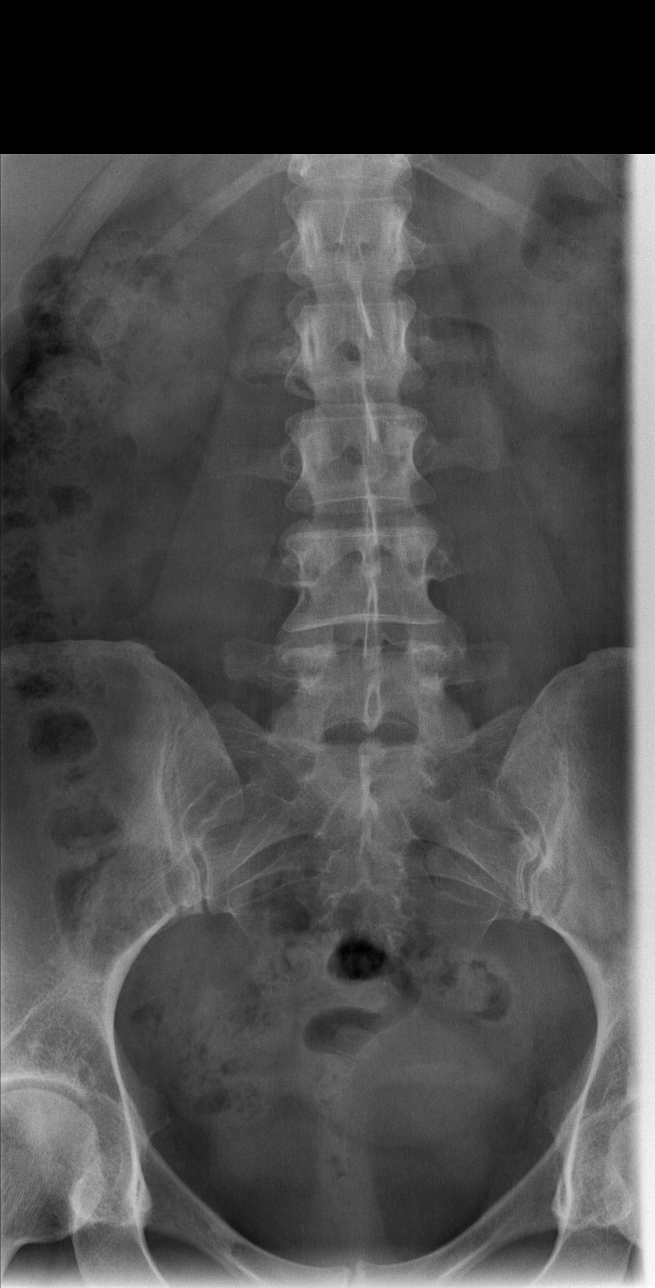

[t l-spine oblique exposure (1 of 2)]
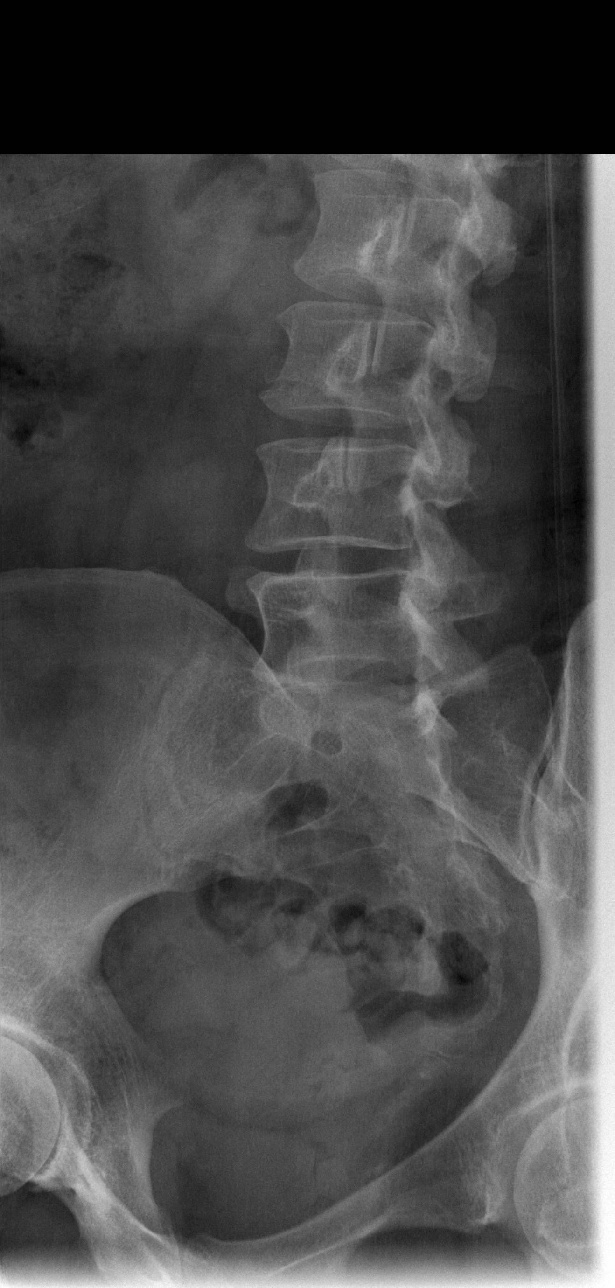

[t l-spine oblique exposure (2 of 2)]
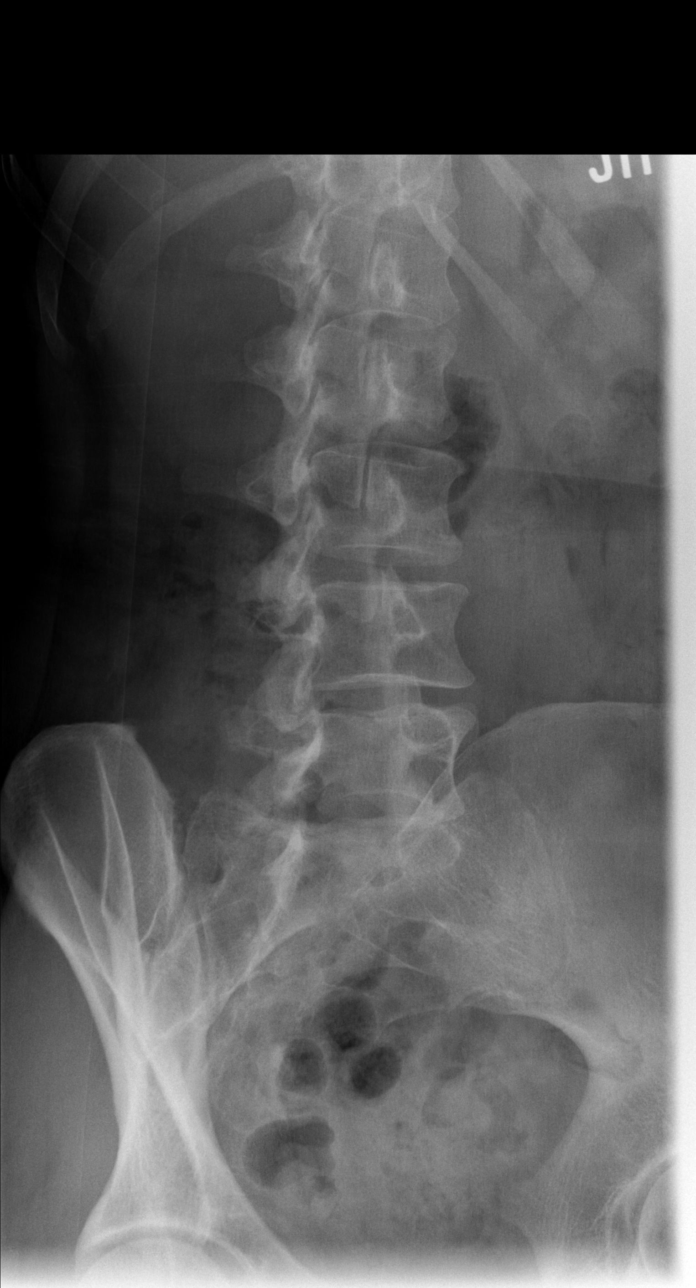

[t l-spine lat]
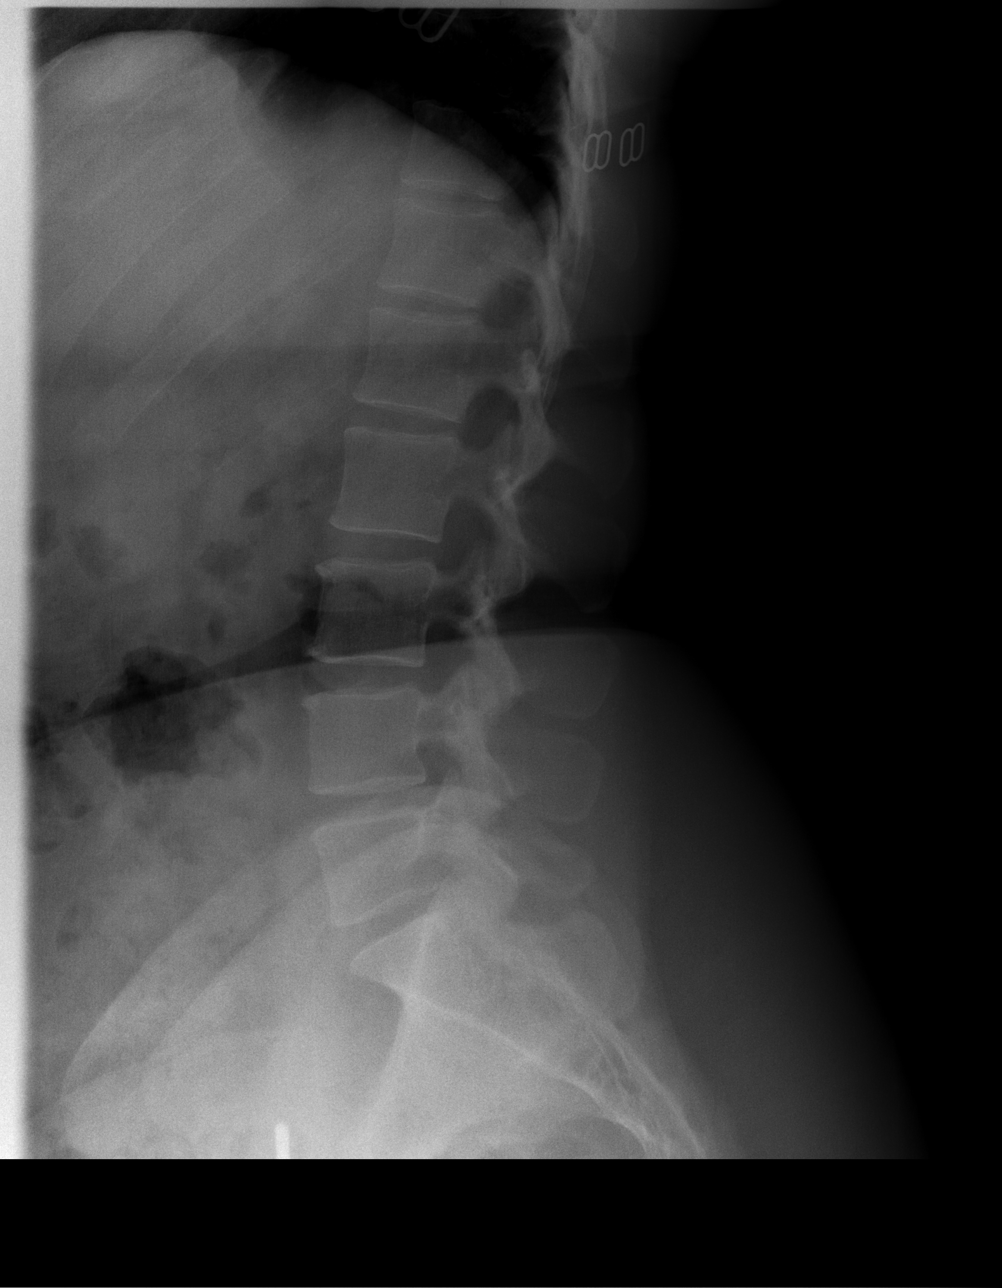

[t l-spine l5-s1 spot]
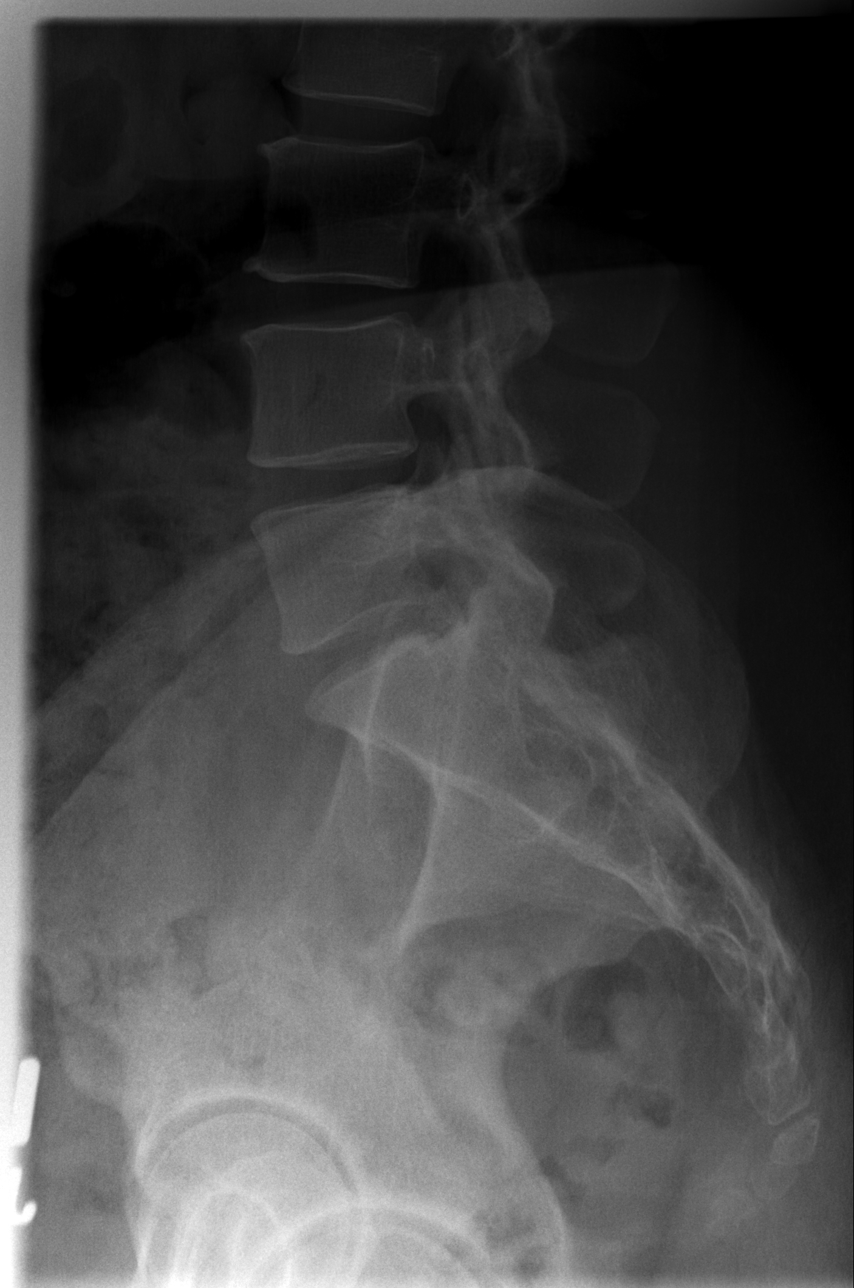

[5 of 5 positions shown; findings below may reference images not displayed]

FINDINGS: There is no evidence of lumbar spine fracture.  Alignment
is normal.  Intervertebral disc spaces are maintained.
IMPRESSION: Negative exam.

## 2012-08-18 ENCOUNTER — Ambulatory Visit (INDEPENDENT_AMBULATORY_CARE_PROVIDER_SITE_OTHER): Payer: 59 | Admitting: Internal Medicine

## 2012-08-18 ENCOUNTER — Encounter: Payer: Self-pay | Admitting: Internal Medicine

## 2012-08-18 VITALS — BP 118/80 | HR 89 | Temp 98.0°F | Wt 190.0 lb

## 2012-08-18 DIAGNOSIS — J329 Chronic sinusitis, unspecified: Secondary | ICD-10-CM | POA: Insufficient documentation

## 2012-08-18 MED ORDER — AZELASTINE HCL 0.1 % NA SOLN: 2.0000 | Freq: Every day | NASAL | Status: DC

## 2012-08-18 MED ORDER — AMOXICILLIN 500 MG PO CAPS: 1000.0000 mg | ORAL_CAPSULE | Freq: Two times a day (BID) | ORAL | Status: DC

## 2012-08-18 NOTE — Progress Notes (Signed)
  Subjective:    Patient ID: Stacey Hickman, female    DOB: Jun 19, 1971, 41 y.o.   MRN: [id number]  HPI    Acute visit Symptoms started 10 days ago with when she initially thought was the flu: Sore throat, fatigue, subjective fever, chills, achiness. She is still feeling slightly achy and quite fatigue. In the last few days has developed green postnasal discharge and cough.  Past Medical History  Diagnosis Date  . Depression   . Insomnia   . Dizziness   . Tendonitis of foot 11/2010    Right foot  . Left knee injury 03/2011    was in immobilizer   No past surgical history on file.  History   Social History  . Marital Status: Married    Spouse Name: N/A    Number of Children: 3  . 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 Smoking status: Never Smoker   . Smokeless tobacco: Never Used  . Alcohol Use: No  . Drug Use: No  . Sexually Active: Yes   Other Topics Concern  . Not on file   Social History Narrative  . No narrative on file    Review of Systems Mild nausea, thinks related to cough, no vomiting. Bowel movements are slightly loose. Currently taking ibuprofen and a antihistaminic     Objective:   Physical Exam General -- alert, well-developed. No apparent distress.  HEENT -- TMs normal, throat w/o redness, face symmetric and not tender to palpation, nose slt congested   Lungs -- normal respiratory effort, no intercostal retractions, no accessory muscle use, and normal breath sounds.  Psych-- Cognition and judgment appear intact. Alert and cooperative with normal attention span and concentration.  not anxious appearing and not depressed appearing.       Assessment & Plan:

## 2012-08-18 NOTE — Assessment & Plan Note (Addendum)
Had a viral syndrome few days ago now with green nasal discharge, likely mild sinusitis. Also has decreaseenergy, probably part of the viral syndrome. Plan: see  instructions

## 2012-08-18 NOTE — Patient Instructions (Addendum)
Rest, fluids , tylenol For cough, take Mucinex DM twice a day as needed  For congestion use astelin nasal spray at night until you feel better Take the antibiotic as prescribed  (Amoxicillin) Call if no better in few days Call anytime if the symptoms are severe

## 2012-08-20 ENCOUNTER — Encounter: Payer: Self-pay | Admitting: Internal Medicine

## 2012-08-28 ENCOUNTER — Other Ambulatory Visit: Payer: Self-pay | Admitting: Family Medicine

## 2012-10-28 ENCOUNTER — Other Ambulatory Visit: Payer: Self-pay

## 2012-10-31 ENCOUNTER — Encounter: Payer: Self-pay | Admitting: Family Medicine

## 2013-03-05 ENCOUNTER — Other Ambulatory Visit: Payer: Self-pay

## 2013-03-05 DIAGNOSIS — Z1231 Encounter for screening mammogram for malignant neoplasm of breast: Secondary | ICD-10-CM

## 2013-03-12 ENCOUNTER — Ambulatory Visit
Admission: RE | Admit: 2013-03-12 | Discharge: 2013-03-12 | Disposition: A | Discharge status: Home / Self Care | Payer: 59 | Source: Ambulatory Visit

## 2013-03-12 DIAGNOSIS — Z1231 Encounter for screening mammogram for malignant neoplasm of breast: Secondary | ICD-10-CM

## 2013-05-15 ENCOUNTER — Other Ambulatory Visit: Payer: Self-pay | Admitting: Family Medicine

## 2013-05-22 ENCOUNTER — Ambulatory Visit (INDEPENDENT_AMBULATORY_CARE_PROVIDER_SITE_OTHER): Payer: 59 | Admitting: Family Medicine

## 2013-05-22 ENCOUNTER — Encounter: Payer: Self-pay | Admitting: Family Medicine

## 2013-05-22 VITALS — BP 114/80 | HR 92 | Temp 98.1°F | Ht 63.5 in | Wt 193.2 lb

## 2013-05-22 DIAGNOSIS — Z0000 Encounter for general adult medical examination without abnormal findings: Secondary | ICD-10-CM

## 2013-05-22 DIAGNOSIS — F329 Major depressive disorder, single episode, unspecified: Secondary | ICD-10-CM

## 2013-05-22 DIAGNOSIS — G43909 Migraine, unspecified, not intractable, without status migrainosus: Secondary | ICD-10-CM

## 2013-05-22 DIAGNOSIS — R5381 Other malaise: Secondary | ICD-10-CM

## 2013-05-22 DIAGNOSIS — F32A Depression, unspecified: Secondary | ICD-10-CM

## 2013-05-22 DIAGNOSIS — R5383 Other fatigue: Secondary | ICD-10-CM

## 2013-05-22 LAB — LIPID PANEL
Cholesterol: 140 mg/dL (ref 0–200)
LDL Cholesterol: 83 mg/dL (ref 0–99)
Triglycerides: 122 mg/dL (ref 0.0–149.0)

## 2013-05-22 LAB — BASIC METABOLIC PANEL
BUN: 11 mg/dL (ref 6–23)
Chloride: 106 mEq/L (ref 96–112)
Glucose, Bld: 77 mg/dL (ref 70–99)
Potassium: 3.6 mEq/L (ref 3.5–5.1)

## 2013-05-22 LAB — CBC WITH DIFFERENTIAL/PLATELET
Eosinophils Relative: 1 % (ref 0.0–5.0)
HCT: 42.7 % (ref 36.0–46.0)
Hemoglobin: 14.3 g/dL (ref 12.0–15.0)
Lymphs Abs: 2 10*3/uL (ref 0.7–4.0)
MCV: 90.7 fl (ref 78.0–100.0)
Monocytes Absolute: 0.4 10*3/uL (ref 0.1–1.0)
Neutro Abs: 5.8 10*3/uL (ref 1.4–7.7)
Platelets: 232 10*3/uL (ref 150.0–400.0)
WBC: 8.3 10*3/uL (ref 4.5–10.5)

## 2013-05-22 LAB — HEPATIC FUNCTION PANEL
ALT: 14 U/L (ref 0–35)
Albumin: 3.6 g/dL (ref 3.5–5.2)
Total Bilirubin: 0.7 mg/dL (ref 0.3–1.2)
Total Protein: 7 g/dL (ref 6.0–8.3)

## 2013-05-22 LAB — POCT URINALYSIS DIPSTICK
Bilirubin, UA: NEGATIVE
Blood, UA: NEGATIVE
Glucose, UA: NEGATIVE
Ketones, UA: NEGATIVE
Spec Grav, UA: 1.025

## 2013-05-22 LAB — VITAMIN B12: Vitamin B-12: 422 pg/mL (ref 211–911)

## 2013-05-22 LAB — TSH: TSH: 2.62 u[IU]/mL (ref 0.35–5.50)

## 2013-05-22 LAB — T3, FREE: T3, Free: 2.8 pg/mL (ref 2.3–4.2)

## 2013-05-22 MED ORDER — FLUOXETINE HCL 20 MG PO CAPS: ORAL_CAPSULE | ORAL | Status: DC

## 2013-05-22 MED ORDER — SUMATRIPTAN SUCCINATE 100 MG PO TABS: ORAL_TABLET | ORAL | Status: DC

## 2013-05-22 NOTE — Progress Notes (Signed)
Subjective:     Stacey Hickman is a 42 y.o. female and is here for a comprehensive physical exam. The patient reports problems - pt c/o exhaustion and trouble losing weight .  History   Social History  . Marital Status: Married    Spouse Name: N/A    Number of Children: 3  . 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 Smoking status: Never Smoker   . Smokeless tobacco: Never Used  . Alcohol Use: No  . Drug Use: No  . Sexual Activity: Yes    Partners: Male   Other Topics Concern  . Not on file   Social History Narrative   Exercising--  30 min 2x a week--- walking   Health Maintenance  Topic Date Due  . Pap Smear  12/03/1988  . Tetanus/tdap  12/03/1989  . Influenza Vaccine  04/13/2013    The following portions of the patient's history were reviewed and updated as appropriate:  She  has a past medical history of Depression; Insomnia; Dizziness; Tendonitis of foot (11/2010); and Left knee injury (03/2011). She  does not have any pertinent problems on file. She  has no past surgical history on file. Her family history includes Diabetes in her brother, brother, father, mother, and paternal grandfather; Hyperlipidemia in her brother; Hypertension in her brother; Hypothyroidism in her brother and mother; Obesity in her brother, father, mother, and sister. She  reports that she has never smoked. She has never used smokeless tobacco. She reports that she does not drink alcohol or use illicit drugs. She has a current medication list which includes the following prescription(s): fiber, fluoxetine, glucosamine-chondroitin, and sumatriptan. Current Outpatient Prescriptions on File Prior to Visit  Medication Sig Dispense Refill  . Calcium Polycarbophil (FIBER) 625 MG TABS Take 1 tablet by mouth as needed.        Marland Kitchen FLUoxetine (PROZAC) 20 MG capsule TAKE 3 CAPSULES BY MOUTH DAILY  270 capsule  1  . SUMAtriptan (IMITREX) 100 MG  tablet Take as directed--- office visit due now  10 tablet  0   No current facility-administered medications on file prior to visit.   She has No Known Allergies..  Review of Systems Review of Systems  Constitutional: Negative for activity change, appetite change and fatigue.  HENT: Negative for hearing loss, congestion, tinnitus and ear discharge.  dentist q69m Eyes: Negative for visual disturbance (see optho q1y -- vision corrected to 20/20 with glasses).  Respiratory: Negative for cough, chest tightness and shortness of breath.   Cardiovascular: Negative for chest pain, palpitations and leg swelling.  Gastrointestinal: Negative for abdominal pain, diarrhea, constipation and abdominal distention.  Genitourinary: Negative for urgency, frequency, decreased urine volume and difficulty urinating.  Musculoskeletal: Negative for back pain, arthralgias and gait problem.  Skin: Negative for color change, pallor and rash.  Neurological: Negative for dizziness, light-headedness, numbness and headaches.  Hematological: Negative for adenopathy. Does not bruise/bleed easily.  Psychiatric/Behavioral: Negative for suicidal ideas, confusion, sleep disturbance, self-injury, dysphoric mood, decreased concentration and agitation.       Objective:    BP 114/80  Pulse 92  Temp(Src) 98.1 F (36.7 C) (Oral)  Ht 5' 3.5" (1.613 m)  Wt 193 lb 3.2 oz (87.635 kg)  BMI 33.68 kg/m2  SpO2 96% General appearance: alert, cooperative, appears stated age and no distress Head: Normocephalic, without obvious abnormality, atraumatic Eyes: conjunctivae/corneas clear. PERRL, EOM's intact. Fundi benign. Ears: normal TM's  and external ear canals both ears Nose: Nares normal. Septum midline. Mucosa normal. No drainage or sinus tenderness. Throat: lips, mucosa, and tongue normal; teeth and gums normal Neck: no adenopathy, no carotid bruit, no JVD, supple, symmetrical, trachea midline and thyroid not enlarged,  symmetric, no tenderness/mass/nodules Back: symmetric, no curvature. ROM normal. No CVA tenderness. Lungs: clear to auscultation bilaterally Breasts: gyn Heart: regular rate and rhythm, S1, S2 normal, no murmur, click, rub or gallop Abdomen: soft, non-tender; bowel sounds normal; no masses,  no organomegaly Pelvic: deferred-gyn Extremities: extremities normal, atraumatic, no cyanosis or edema Pulses: 2+ and symmetric Skin: Skin color, texture, turgor normal. No rashes or lesions Lymph nodes: Cervical, supraclavicular, and axillary nodes normal. Neurologic: Alert and oriented X 3, normal strength and tone. Normal symmetric reflexes. Normal coordination and gait Psych-- no depression, no anxiety      Assessment:    Healthy female exam.    Plan:    ghm utd Check labs See After Visit Summary for Counseling Recommendations

## 2013-05-22 NOTE — Patient Instructions (Signed)
Preventive Care for Adults, Female A healthy lifestyle and preventive care can promote health and wellness. Preventive health guidelines for women include the following key practices.  A routine yearly physical is a good way to check with your caregiver about your health and preventive screening. It is a chance to share any concerns and updates on your health, and to receive a thorough exam.  Visit your dentist for a routine exam and preventive care every 6 months. Brush your teeth twice a day and floss once a day. Good oral hygiene prevents tooth decay and gum disease.  The frequency of eye exams is based on your age, health, family medical history, use of contact lenses, and other factors. Follow your caregiver's recommendations for frequency of eye exams.  Eat a healthy diet. Foods like vegetables, fruits, whole grains, low-fat dairy products, and lean protein foods contain the nutrients you need without too many calories. Decrease your intake of foods high in solid fats, added sugars, and salt. Eat the right amount of calories for you.Get information about a proper diet from your caregiver, if necessary.  Regular physical exercise is one of the most important things you can do for your health. Most adults should get at least 150 minutes of moderate-intensity exercise (any activity that increases your heart rate and causes you to sweat) each week. In addition, most adults need muscle-strengthening exercises on 2 or more days a week.  Maintain a healthy weight. The body mass index (BMI) is a screening tool to identify possible weight problems. It provides an estimate of body fat based on height and weight. Your caregiver can help determine your BMI, and can help you achieve or maintain a healthy weight.For adults 20 years and older:  A BMI below 18.5 is considered underweight.  A BMI of 18.5 to 24.9 is normal.  A BMI of 25 to 29.9 is considered overweight.  A BMI of 30 and above is  considered obese.  Maintain normal blood lipids and cholesterol levels by exercising and minimizing your intake of saturated fat. Eat a balanced diet with plenty of fruit and vegetables. Blood tests for lipids and cholesterol should begin at age 20 and be repeated every 5 years. If your lipid or cholesterol levels are high, you are over 50, or you are at high risk for heart disease, you may need your cholesterol levels checked more frequently.Ongoing high lipid and cholesterol levels should be treated with medicines if diet and exercise are not effective.  If you smoke, find out from your caregiver how to quit. If you do not use tobacco, do not start.  If you are pregnant, do not drink alcohol. If you are breastfeeding, be very cautious about drinking alcohol. If you are not pregnant and choose to drink alcohol, do not exceed 1 drink per day. One drink is considered to be 12 ounces (355 mL) of beer, 5 ounces (148 mL) of wine, or 1.5 ounces (44 mL) of liquor.  Avoid use of street drugs. Do not share needles with anyone. Ask for help if you need support or instructions about stopping the use of drugs.  High blood pressure causes heart disease and increases the risk of stroke. Your blood pressure should be checked at least every 1 to 2 years. Ongoing high blood pressure should be treated with medicines if weight loss and exercise are not effective.  If you are 55 to 42 years old, ask your caregiver if you should take aspirin to prevent strokes.  Diabetes   screening involves taking a blood sample to check your fasting blood sugar level. This should be done once every 3 years, after age 45, if you are within normal weight and without risk factors for diabetes. Testing should be considered at a younger age or be carried out more frequently if you are overweight and have at least 1 risk factor for diabetes.  Breast cancer screening is essential preventive care for women. You should practice "breast  self-awareness." This means understanding the normal appearance and feel of your breasts and may include breast self-examination. Any changes detected, no matter how small, should be reported to a caregiver. Women in their 20s and 30s should have a clinical breast exam (CBE) by a caregiver as part of a regular health exam every 1 to 3 years. After age 40, women should have a CBE every year. Starting at age 40, women should consider having a mammography (breast X-ray test) every year. Women who have a family history of breast cancer should talk to their caregiver about genetic screening. Women at a high risk of breast cancer should talk to their caregivers about having magnetic resonance imaging (MRI) and a mammography every year.  The Pap test is a screening test for cervical cancer. A Pap test can show cell changes on the cervix that might become cervical cancer if left untreated. A Pap test is a procedure in which cells are obtained and examined from the lower end of the uterus (cervix).  Women should have a Pap test starting at age 21.  Between ages 21 and 29, Pap tests should be repeated every 2 years.  Beginning at age 30, you should have a Pap test every 3 years as long as the past 3 Pap tests have been normal.  Some women have medical problems that increase the chance of getting cervical cancer. Talk to your caregiver about these problems. It is especially important to talk to your caregiver if a new problem develops soon after your last Pap test. In these cases, your caregiver may recommend more frequent screening and Pap tests.  The above recommendations are the same for women who have or have not gotten the vaccine for human papillomavirus (HPV).  If you had a hysterectomy for a problem that was not cancer or a condition that could lead to cancer, then you no longer need Pap tests. Even if you no longer need a Pap test, a regular exam is a good idea to make sure no other problems are  starting.  If you are between ages 65 and 70, and you have had normal Pap tests going back 10 years, you no longer need Pap tests. Even if you no longer need a Pap test, a regular exam is a good idea to make sure no other problems are starting.  If you have had past treatment for cervical cancer or a condition that could lead to cancer, you need Pap tests and screening for cancer for at least 20 years after your treatment.  If Pap tests have been discontinued, risk factors (such as a new sexual partner) need to be reassessed to determine if screening should be resumed.  The HPV test is an additional test that may be used for cervical cancer screening. The HPV test looks for the virus that can cause the cell changes on the cervix. The cells collected during the Pap test can be tested for HPV. The HPV test could be used to screen women aged 30 years and older, and should   be used in women of any age who have unclear Pap test results. After the age of 30, women should have HPV testing at the same frequency as a Pap test.  Colorectal cancer can be detected and often prevented. Most routine colorectal cancer screening begins at the age of 50 and continues through age 75. However, your caregiver may recommend screening at an earlier age if you have risk factors for colon cancer. On a yearly basis, your caregiver may provide home test kits to check for hidden blood in the stool. Use of a small camera at the end of a tube, to directly examine the colon (sigmoidoscopy or colonoscopy), can detect the earliest forms of colorectal cancer. Talk to your caregiver about this at age 50, when routine screening begins. Direct examination of the colon should be repeated every 5 to 10 years through age 75, unless early forms of pre-cancerous polyps or small growths are found.  Hepatitis C blood testing is recommended for all people born from 1945 through 1965 and any individual with known risks for hepatitis C.  Practice  safe sex. Use condoms and avoid high-risk sexual practices to reduce the spread of sexually transmitted infections (STIs). STIs include gonorrhea, chlamydia, syphilis, trichomonas, herpes, HPV, and human immunodeficiency virus (HIV). Herpes, HIV, and HPV are viral illnesses that have no cure. They can result in disability, cancer, and death. Sexually active women aged 25 and younger should be checked for chlamydia. Older women with new or multiple partners should also be tested for chlamydia. Testing for other STIs is recommended if you are sexually active and at increased risk.  Osteoporosis is a disease in which the bones lose minerals and strength with aging. This can result in serious bone fractures. The risk of osteoporosis can be identified using a bone density scan. Women ages 65 and over and women at risk for fractures or osteoporosis should discuss screening with their caregivers. Ask your caregiver whether you should take a calcium supplement or vitamin D to reduce the rate of osteoporosis.  Menopause can be associated with physical symptoms and risks. Hormone replacement therapy is available to decrease symptoms and risks. You should talk to your caregiver about whether hormone replacement therapy is right for you.  Use sunscreen with sun protection factor (SPF) of 30 or more. Apply sunscreen liberally and repeatedly throughout the day. You should seek shade when your shadow is shorter than you. Protect yourself by wearing long sleeves, pants, a wide-brimmed hat, and sunglasses year round, whenever you are outdoors.  Once a month, do a whole body skin exam, using a mirror to look at the skin on your back. Notify your caregiver of new moles, moles that have irregular borders, moles that are larger than a pencil eraser, or moles that have changed in shape or color.  Stay current with required immunizations.  Influenza. You need a dose every fall (or winter). The composition of the flu vaccine  changes each year, so being vaccinated once is not enough.  Pneumococcal polysaccharide. You need 1 to 2 doses if you smoke cigarettes or if you have certain chronic medical conditions. You need 1 dose at age 65 (or older) if you have never been vaccinated.  Tetanus, diphtheria, pertussis (Tdap, Td). Get 1 dose of Tdap vaccine if you are younger than age 65, are over 65 and have contact with an infant, are a healthcare worker, are pregnant, or simply want to be protected from whooping cough. After that, you need a Td   booster dose every 10 years. Consult your caregiver if you have not had at least 3 tetanus and diphtheria-containing shots sometime in your life or have a deep or dirty wound.  HPV. You need this vaccine if you are a woman age 26 or younger. The vaccine is given in 3 doses over 6 months.  Measles, mumps, rubella (MMR). You need at least 1 dose of MMR if you were born in 1957 or later. You may also need a second dose.  Meningococcal. If you are age 19 to 21 and a first-year college student living in a residence hall, or have one of several medical conditions, you need to get vaccinated against meningococcal disease. You may also need additional booster doses.  Zoster (shingles). If you are age 60 or older, you should get this vaccine.  Varicella (chickenpox). If you have never had chickenpox or you were vaccinated but received only 1 dose, talk to your caregiver to find out if you need this vaccine.  Hepatitis A. You need this vaccine if you have a specific risk factor for hepatitis A virus infection or you simply wish to be protected from this disease. The vaccine is usually given as 2 doses, 6 to 18 months apart.  Hepatitis B. You need this vaccine if you have a specific risk factor for hepatitis B virus infection or you simply wish to be protected from this disease. The vaccine is given in 3 doses, usually over 6 months. Preventive Services / Frequency Ages 19 to 39  Blood  pressure check.** / Every 1 to 2 years.  Lipid and cholesterol check.** / Every 5 years beginning at age 20.  Clinical breast exam.** / Every 3 years for women in their 20s and 30s.  Pap test.** / Every 2 years from ages 21 through 29. Every 3 years starting at age 30 through age 65 or 70 with a history of 3 consecutive normal Pap tests.  HPV screening.** / Every 3 years from ages 30 through ages 65 to 70 with a history of 3 consecutive normal Pap tests.  Hepatitis C blood test.** / For any individual with known risks for hepatitis C.  Skin self-exam. / Monthly.  Influenza immunization.** / Every year.  Pneumococcal polysaccharide immunization.** / 1 to 2 doses if you smoke cigarettes or if you have certain chronic medical conditions.  Tetanus, diphtheria, pertussis (Tdap, Td) immunization. / A one-time dose of Tdap vaccine. After that, you need a Td booster dose every 10 years.  HPV immunization. / 3 doses over 6 months, if you are 26 and younger.  Measles, mumps, rubella (MMR) immunization. / You need at least 1 dose of MMR if you were born in 1957 or later. You may also need a second dose.  Meningococcal immunization. / 1 dose if you are age 19 to 21 and a first-year college student living in a residence hall, or have one of several medical conditions, you need to get vaccinated against meningococcal disease. You may also need additional booster doses.  Varicella immunization.** / Consult your caregiver.  Hepatitis A immunization.** / Consult your caregiver. 2 doses, 6 to 18 months apart.  Hepatitis B immunization.** / Consult your caregiver. 3 doses usually over 6 months. Ages 40 to 64  Blood pressure check.** / Every 1 to 2 years.  Lipid and cholesterol check.** / Every 5 years beginning at age 20.  Clinical breast exam.** / Every year after age 40.  Mammogram.** / Every year beginning at age 40   and continuing for as long as you are in good health. Consult with your  caregiver.  Pap test.** / Every 3 years starting at age 30 through age 65 or 70 with a history of 3 consecutive normal Pap tests.  HPV screening.** / Every 3 years from ages 30 through ages 65 to 70 with a history of 3 consecutive normal Pap tests.  Fecal occult blood test (FOBT) of stool. / Every year beginning at age 50 and continuing until age 75. You may not need to do this test if you get a colonoscopy every 10 years.  Flexible sigmoidoscopy or colonoscopy.** / Every 5 years for a flexible sigmoidoscopy or every 10 years for a colonoscopy beginning at age 50 and continuing until age 75.  Hepatitis C blood test.** / For all people born from 1945 through 1965 and any individual with known risks for hepatitis C.  Skin self-exam. / Monthly.  Influenza immunization.** / Every year.  Pneumococcal polysaccharide immunization.** / 1 to 2 doses if you smoke cigarettes or if you have certain chronic medical conditions.  Tetanus, diphtheria, pertussis (Tdap, Td) immunization.** / A one-time dose of Tdap vaccine. After that, you need a Td booster dose every 10 years.  Measles, mumps, rubella (MMR) immunization. / You need at least 1 dose of MMR if you were born in 1957 or later. You may also need a second dose.  Varicella immunization.** / Consult your caregiver.  Meningococcal immunization.** / Consult your caregiver.  Hepatitis A immunization.** / Consult your caregiver. 2 doses, 6 to 18 months apart.  Hepatitis B immunization.** / Consult your caregiver. 3 doses, usually over 6 months. Ages 65 and over  Blood pressure check.** / Every 1 to 2 years.  Lipid and cholesterol check.** / Every 5 years beginning at age 20.  Clinical breast exam.** / Every year after age 40.  Mammogram.** / Every year beginning at age 40 and continuing for as long as you are in good health. Consult with your caregiver.  Pap test.** / Every 3 years starting at age 30 through age 65 or 70 with a 3  consecutive normal Pap tests. Testing can be stopped between 65 and 70 with 3 consecutive normal Pap tests and no abnormal Pap or HPV tests in the past 10 years.  HPV screening.** / Every 3 years from ages 30 through ages 65 or 70 with a history of 3 consecutive normal Pap tests. Testing can be stopped between 65 and 70 with 3 consecutive normal Pap tests and no abnormal Pap or HPV tests in the past 10 years.  Fecal occult blood test (FOBT) of stool. / Every year beginning at age 50 and continuing until age 75. You may not need to do this test if you get a colonoscopy every 10 years.  Flexible sigmoidoscopy or colonoscopy.** / Every 5 years for a flexible sigmoidoscopy or every 10 years for a colonoscopy beginning at age 50 and continuing until age 75.  Hepatitis C blood test.** / For all people born from 1945 through 1965 and any individual with known risks for hepatitis C.  Osteoporosis screening.** / A one-time screening for women ages 65 and over and women at risk for fractures or osteoporosis.  Skin self-exam. / Monthly.  Influenza immunization.** / Every year.  Pneumococcal polysaccharide immunization.** / 1 dose at age 65 (or older) if you have never been vaccinated.  Tetanus, diphtheria, pertussis (Tdap, Td) immunization. / A one-time dose of Tdap vaccine if you are over   65 and have contact with an infant, are a healthcare worker, or simply want to be protected from whooping cough. After that, you need a Td booster dose every 10 years.  Varicella immunization.** / Consult your caregiver.  Meningococcal immunization.** / Consult your caregiver.  Hepatitis A immunization.** / Consult your caregiver. 2 doses, 6 to 18 months apart.  Hepatitis B immunization.** / Check with your caregiver. 3 doses, usually over 6 months. ** Family history and personal history of risk and conditions may change your caregiver's recommendations. Document Released: 10/26/2001 Document Revised: 11/22/2011  Document Reviewed: 01/25/2011 [employer] Patient Information 2014 [employer].  

## 2013-05-22 NOTE — Assessment & Plan Note (Signed)
Check labs 

## 2013-05-25 ENCOUNTER — Encounter: Payer: Self-pay | Admitting: Family Medicine

## 2013-05-25 ENCOUNTER — Other Ambulatory Visit: Payer: Self-pay | Admitting: Dermatology

## 2013-05-25 LAB — VITAMIN D 1,25 DIHYDROXY
Vitamin D 1, 25 (OH)2 Total: 78 pg/mL — ABNORMAL HIGH (ref 18–72)
Vitamin D2 1, 25 (OH)2: <8 pg/mL

## 2013-05-28 ENCOUNTER — Ambulatory Visit (INDEPENDENT_AMBULATORY_CARE_PROVIDER_SITE_OTHER): Payer: 59 | Admitting: Family Medicine

## 2013-05-28 ENCOUNTER — Encounter: Payer: Self-pay | Admitting: Family Medicine

## 2013-05-28 DIAGNOSIS — Z5181 Encounter for therapeutic drug level monitoring: Secondary | ICD-10-CM

## 2013-05-28 DIAGNOSIS — E669 Obesity, unspecified: Secondary | ICD-10-CM

## 2013-05-28 DIAGNOSIS — G43909 Migraine, unspecified, not intractable, without status migrainosus: Secondary | ICD-10-CM

## 2013-05-28 DIAGNOSIS — E66811 Obesity, class 1: Secondary | ICD-10-CM | POA: Insufficient documentation

## 2013-05-28 MED ORDER — TOPIRAMATE 50 MG PO TABS: 50.0000 mg | ORAL_TABLET | Freq: Two times a day (BID) | ORAL | Status: DC

## 2013-05-28 MED ORDER — TOPIRAMATE 25 MG PO TABS: ORAL_TABLET | ORAL | Status: DC

## 2013-05-28 NOTE — Assessment & Plan Note (Signed)
con't diet and exercise topamax per meds and orders rto 1 month or sooner prn

## 2013-05-28 NOTE — Assessment & Plan Note (Signed)
topamax per meds and orders rto 1 month

## 2013-05-28 NOTE — Progress Notes (Signed)
  Subjective:    Patient ID: Stacey Hickman, female    DOB: March 20, 1971, 42 y.o.   MRN: [id number]  HPI Pt here to discuss weight loss.  She is exercising and watching what she eats and is struggling to lose weight.  Pt is interested in topamax/ phenteramine combo pill.   Review of Systems As above    Objective:   Physical Exam BP 112/76  Pulse 91  Temp(Src) 98.4 F (36.9 C) (Oral)  Wt 191 lb 9.6 oz (86.909 kg)  BMI 33.4 kg/m2  SpO2 97% General appearance: alert, cooperative, appears stated age and no distress Lungs: clear to auscultation bilaterally Heart: S1, S2 normal  EKG-- NSR        Assessment & Plan:

## 2013-05-28 NOTE — Patient Instructions (Addendum)
Headache and Allergies The relationship between allergies and headaches is unclear. Many people with allergic or infectious nasal problems also have headaches (migraines or sinus headaches). However, sometimes allergies can cause pressure that feels like a headache, and sometimes headaches can cause allergy-like symptoms. It is not always clear whether your symptoms are caused by allergies or by a headache. CAUSES   Migraine: The cause of a migraine is not always known.  Sinus Headache: The cause of a sinus headache may be a sinus infection. Other conditions that may be related to sinus headaches include:  Hay fever (allergic rhinitis).  Deviation of the nasal septum.  Swelling or clogging of the nasal passages. SYMPTOMS  Migraine headache symptoms (which often last 4 to 72 hours) include:  Intense, throbbing pain on one or both sides of the head.  Nausea.  Vomiting.  Being extra sensitive to light.  Being extra sensitive to sound.  Nervous system reactions that appear similar to an allergic reaction:  Stuffy nose.  Runny nose.  Tearing. Sinus headaches are felt as facial pain or pressure.  DIAGNOSIS  Because there is some overlap in symptoms, sinus and migraine headaches are often misdiagnosed. For example, a person with migraines may also feel facial pressure. Likewise, many people with hay fever may get migraine headaches rather than sinus headaches. These migraines can be triggered by the histamine release during an allergic reaction. An antihistamine medicine can eliminate this pain. There are standard criteria that help clarify the difference between these headaches and related allergy or allergy-like symptoms. Your caregiver can use these criteria to determine the proper diagnosis and provide you the best care. TREATMENT  Migraine medicine may help people who have persistent migraine headaches even though their hay fever is controlled. For some people, anti-inflammatory  treatments do not work to relieve migraines. Medicines called triptans (such as sumatriptan) can be helpful for those people. Document Released: 11/20/2003 Document Revised: 11/22/2011 Document Reviewed: 12/12/2009 [employer] Patient Information 2014 [employer].  

## 2013-06-21 ENCOUNTER — Encounter: Payer: Self-pay | Admitting: Family Medicine

## 2013-06-27 ENCOUNTER — Ambulatory Visit: Payer: 59 | Admitting: Family Medicine

## 2013-09-13 NOTE — L&D Delivery Note (Signed)
Delivery Note At 8:07 AM a viable female, "[name], was delivered via Vaginal, Spontaneous Delivery (Presentation: ; Occiput Anterior).  APGAR: 8, 8; weight .   Placenta status: Intact, Spontaneous.  Cord: 3 vessels with the following complications: None.  Cord pH: NA Patient had SROM, clear fluid, upon transfer to L&D, with rapid progression to delivery after that.  Anesthesia: Local  Episiotomy: None Lacerations: 1st degree right periurethral--no repair required; 1st degree perineal--repaired for hemostasis Suture Repair: 3.0 vicryl Est. Blood Loss (mL): 250  Mom to postpartum.  Baby to Couplet care / Skin to Skin. Family plans inpatient circumcision.  Byrd Terrero, [place] 05/27/2014, 8:45 AM

## 2013-10-08 ENCOUNTER — Encounter (HOSPITAL_COMMUNITY): Payer: Self-pay | Admitting: Obstetrics and Gynecology

## 2013-10-08 DIAGNOSIS — O09529 Supervision of elderly multigravida, unspecified trimester: Secondary | ICD-10-CM | POA: Insufficient documentation

## 2013-10-08 DIAGNOSIS — O26899 Other specified pregnancy related conditions, unspecified trimester: Secondary | ICD-10-CM

## 2013-10-08 DIAGNOSIS — O09299 Supervision of pregnancy with other poor reproductive or obstetric history, unspecified trimester: Secondary | ICD-10-CM | POA: Insufficient documentation

## 2013-10-08 DIAGNOSIS — Z6791 Unspecified blood type, Rh negative: Secondary | ICD-10-CM | POA: Insufficient documentation

## 2013-10-08 LAB — OB RESULTS CONSOLE GC/CHLAMYDIA
Chlamydia: NEGATIVE
Gonorrhea: NEGATIVE

## 2013-10-08 LAB — OB RESULTS CONSOLE RUBELLA ANTIBODY, IGM: Rubella: IMMUNE

## 2013-10-08 LAB — OB RESULTS CONSOLE HEPATITIS B SURFACE ANTIGEN: HEP B S AG: NEGATIVE

## 2013-10-08 LAB — OB RESULTS CONSOLE RPR: RPR: NONREACTIVE

## 2013-10-08 LAB — OB RESULTS CONSOLE HIV ANTIBODY (ROUTINE TESTING): HIV: NONREACTIVE

## 2013-12-11 ENCOUNTER — Encounter: Payer: Self-pay | Admitting: Family Medicine

## 2013-12-12 ENCOUNTER — Other Ambulatory Visit: Payer: Self-pay | Admitting: Family Medicine

## 2013-12-12 DIAGNOSIS — F32A Depression, unspecified: Secondary | ICD-10-CM

## 2013-12-12 DIAGNOSIS — F329 Major depressive disorder, single episode, unspecified: Secondary | ICD-10-CM

## 2013-12-12 MED ORDER — SERTRALINE HCL 50 MG PO TABS: 50.0000 mg | ORAL_TABLET | Freq: Every day | ORAL | Status: DC

## 2014-04-04 ENCOUNTER — Encounter: Payer: Self-pay | Admitting: Family Medicine

## 2014-04-04 MED ORDER — SERTRALINE HCL 100 MG PO TABS: 100.0000 mg | ORAL_TABLET | Freq: Every day | ORAL | Status: DC

## 2014-04-04 NOTE — Telephone Encounter (Signed)
Med filled.  

## 2014-04-04 NOTE — Telephone Encounter (Signed)
Pt has not been seen since 05/28/13. Please advise.   Dr. Etter Sjogren I was wondering if you would be willing to increase the Sertraline to 100 mg daily? I have noticed that I am more reactionary, quick tempered and more tearful. If I need to come in for a visit I am willing to. Just so you know, the stress in my life has increased. Jim's father is very sick and may pass away very soon. There as are a few financial issues we are dealing with as well. I appreciate all your help. Irelyn

## 2014-05-06 LAB — OB RESULTS CONSOLE GBS: GBS: NEGATIVE

## 2014-05-27 ENCOUNTER — Inpatient Hospital Stay (HOSPITAL_COMMUNITY)
Admission: AD | Admit: 2014-05-27 | Discharge: 2014-05-29 | DRG: 775 | Disposition: A | Discharge status: Home / Self Care | Payer: 59 | Source: Ambulatory Visit | Attending: Obstetrics and Gynecology | Admitting: Obstetrics and Gynecology

## 2014-05-27 ENCOUNTER — Encounter (HOSPITAL_COMMUNITY): Payer: Self-pay | Admitting: *Deleted

## 2014-05-27 DIAGNOSIS — O9912 Other diseases of the blood and blood-forming organs and certain disorders involving the immune mechanism complicating childbirth: Secondary | ICD-10-CM

## 2014-05-27 DIAGNOSIS — Z833 Family history of diabetes mellitus: Secondary | ICD-10-CM | POA: Diagnosis not present

## 2014-05-27 DIAGNOSIS — O09899 Supervision of other high risk pregnancies, unspecified trimester: Secondary | ICD-10-CM

## 2014-05-27 DIAGNOSIS — D689 Coagulation defect, unspecified: Secondary | ICD-10-CM | POA: Diagnosis present

## 2014-05-27 DIAGNOSIS — O360990 Maternal care for other rhesus isoimmunization, unspecified trimester, not applicable or unspecified: Secondary | ICD-10-CM | POA: Diagnosis present

## 2014-05-27 DIAGNOSIS — O09529 Supervision of elderly multigravida, unspecified trimester: Secondary | ICD-10-CM | POA: Diagnosis present

## 2014-05-27 DIAGNOSIS — Z8249 Family history of ischemic heart disease and other diseases of the circulatory system: Secondary | ICD-10-CM

## 2014-05-27 DIAGNOSIS — O139 Gestational [pregnancy-induced] hypertension without significant proteinuria, unspecified trimester: Secondary | ICD-10-CM | POA: Diagnosis present

## 2014-05-27 DIAGNOSIS — O479 False labor, unspecified: Secondary | ICD-10-CM | POA: Diagnosis present

## 2014-05-27 DIAGNOSIS — D696 Thrombocytopenia, unspecified: Secondary | ICD-10-CM | POA: Diagnosis present

## 2014-05-27 DIAGNOSIS — O09219 Supervision of pregnancy with history of pre-term labor, unspecified trimester: Secondary | ICD-10-CM

## 2014-05-27 HISTORY — DX: Headache: R51

## 2014-05-27 HISTORY — DX: Gestational (pregnancy-induced) hypertension without significant proteinuria, unspecified trimester: O13.9

## 2014-05-27 LAB — COMPREHENSIVE METABOLIC PANEL
ALT: 10 U/L (ref 0–35)
ANION GAP: 14 (ref 5–15)
AST: 18 U/L (ref 0–37)
Albumin: 2.4 g/dL — ABNORMAL LOW (ref 3.5–5.2)
Alkaline Phosphatase: 142 U/L — ABNORMAL HIGH (ref 39–117)
BUN: 7 mg/dL (ref 6–23)
CO2: 19 mEq/L (ref 19–32)
CREATININE: 0.67 mg/dL (ref 0.50–1.10)
Calcium: 8.2 mg/dL — ABNORMAL LOW (ref 8.4–10.5)
Chloride: 103 mEq/L (ref 96–112)
GFR calc non Af Amer: >90 mL/min (ref >90)
GLUCOSE: 78 mg/dL (ref 70–99)
POTASSIUM: 3.9 meq/L (ref 3.7–5.3)
Sodium: 136 mEq/L — ABNORMAL LOW (ref 137–147)
TOTAL PROTEIN: 5.9 g/dL — AB (ref 6.0–8.3)
Total Bilirubin: 0.2 mg/dL — ABNORMAL LOW (ref 0.3–1.2)

## 2014-05-27 LAB — CBC
HEMATOCRIT: 35.2 % — AB (ref 36.0–46.0)
Hemoglobin: 12.1 g/dL (ref 12.0–15.0)
MCH: 29.4 pg (ref 26.0–34.0)
MCHC: 34.4 g/dL (ref 30.0–36.0)
MCV: 85.6 fL (ref 78.0–100.0)
Platelets: 149 10*3/uL — ABNORMAL LOW (ref 150–400)
RBC: 4.11 MIL/uL (ref 3.87–5.11)
RDW: 13.4 % (ref 11.5–15.5)
WBC: 9.7 10*3/uL (ref 4.0–10.5)

## 2014-05-27 LAB — URIC ACID: Uric Acid, Serum: 5.2 mg/dL (ref 2.4–7.0)

## 2014-05-27 LAB — PROTEIN / CREATININE RATIO, URINE
Creatinine, Urine: 266.34 mg/dL
Protein Creatinine Ratio: 0.28 — ABNORMAL HIGH (ref 0.00–0.15)
Total Protein, Urine: 75.8 mg/dL

## 2014-05-27 LAB — RPR

## 2014-05-27 LAB — LACTATE DEHYDROGENASE: LDH: 176 U/L (ref 94–250)

## 2014-05-27 MED ORDER — ACETAMINOPHEN 325 MG PO TABS: 650.0000 mg | ORAL_TABLET | ORAL | Status: DC | PRN

## 2014-05-27 MED ORDER — IBUPROFEN 600 MG PO TABS
600.0000 mg | ORAL_TABLET | Freq: Four times a day (QID) | ORAL | Status: DC
  Administered 2014-05-27 – 2014-05-29 (×8): 600 mg via ORAL
  Filled 2014-05-27 (×9): qty 1

## 2014-05-27 MED ORDER — OXYTOCIN BOLUS FROM INFUSION
500.0000 mL | INTRAVENOUS | Status: DC
  Administered 2014-05-27: 500 mL via INTRAVENOUS

## 2014-05-27 MED ORDER — ONDANSETRON HCL 4 MG PO TABS: 4.0000 mg | ORAL_TABLET | ORAL | Status: DC | PRN

## 2014-05-27 MED ORDER — CLOTRIMAZOLE 1 % EX CREA
TOPICAL_CREAM | CUTANEOUS | Status: DC | PRN
  Administered 2014-05-27: 21:00:00 via TOPICAL
  Filled 2014-05-27: qty 15

## 2014-05-27 MED ORDER — SENNOSIDES-DOCUSATE SODIUM 8.6-50 MG PO TABS
2.0000 | ORAL_TABLET | ORAL | Status: DC
  Administered 2014-05-28 (×2): 2 via ORAL
  Filled 2014-05-27 (×3): qty 2

## 2014-05-27 MED ORDER — PRENATAL MULTIVITAMIN CH
1.0000 | ORAL_TABLET | Freq: Every day | ORAL | Status: DC
  Administered 2014-05-27 – 2014-05-29 (×3): 1 via ORAL
  Filled 2014-05-27 (×3): qty 1

## 2014-05-27 MED ORDER — SIMETHICONE 80 MG PO CHEW: 80.0000 mg | CHEWABLE_TABLET | ORAL | Status: DC | PRN

## 2014-05-27 MED ORDER — ONDANSETRON HCL 4 MG/2ML IJ SOLN: 4.0000 mg | Freq: Four times a day (QID) | INTRAMUSCULAR | Status: DC | PRN

## 2014-05-27 MED ORDER — ONDANSETRON HCL 4 MG/2ML IJ SOLN: 4.0000 mg | INTRAMUSCULAR | Status: DC | PRN

## 2014-05-27 MED ORDER — FLEET ENEMA 7-19 GM/118ML RE ENEM: 1.0000 | ENEMA | RECTAL | Status: DC | PRN

## 2014-05-27 MED ORDER — BENZOCAINE-MENTHOL 20-0.5 % EX AERO
1.0000 "application " | INHALATION_SPRAY | CUTANEOUS | Status: DC | PRN
  Administered 2014-05-28: 1 via TOPICAL
  Filled 2014-05-27 (×2): qty 56

## 2014-05-27 MED ORDER — BUTORPHANOL TARTRATE 1 MG/ML IJ SOLN: 1.0000 mg | INTRAMUSCULAR | Status: DC | PRN

## 2014-05-27 MED ORDER — OXYCODONE-ACETAMINOPHEN 5-325 MG PO TABS
2.0000 | ORAL_TABLET | ORAL | Status: DC | PRN
Start: 2014-05-27 — End: 2014-05-27

## 2014-05-27 MED ORDER — LIDOCAINE HCL (PF) 1 % IJ SOLN
30.0000 mL | INTRAMUSCULAR | Status: DC | PRN
  Filled 2014-05-27: qty 30

## 2014-05-27 MED ORDER — SERTRALINE HCL 100 MG PO TABS
100.0000 mg | ORAL_TABLET | Freq: Every day | ORAL | Status: DC
  Administered 2014-05-28 – 2014-05-29 (×2): 100 mg via ORAL
  Filled 2014-05-27 (×3): qty 1

## 2014-05-27 MED ORDER — OXYCODONE-ACETAMINOPHEN 5-325 MG PO TABS
1.0000 | ORAL_TABLET | ORAL | Status: DC | PRN
  Administered 2014-05-27 – 2014-05-29 (×7): 1 via ORAL
  Filled 2014-05-27 (×9): qty 1

## 2014-05-27 MED ORDER — OXYCODONE-ACETAMINOPHEN 5-325 MG PO TABS
2.0000 | ORAL_TABLET | ORAL | Status: DC | PRN
  Administered 2014-05-27 – 2014-05-28 (×2): 2 via ORAL
  Filled 2014-05-27: qty 2

## 2014-05-27 MED ORDER — LACTATED RINGERS IV SOLN: 500.0000 mL | INTRAVENOUS | Status: DC | PRN

## 2014-05-27 MED ORDER — TETANUS-DIPHTH-ACELL PERTUSSIS 5-2.5-18.5 LF-MCG/0.5 IM SUSP
0.5000 mL | Freq: Once | INTRAMUSCULAR | Status: AC
  Administered 2014-05-28: 0.5 mL via INTRAMUSCULAR
  Filled 2014-05-27: qty 0.5

## 2014-05-27 MED ORDER — OXYCODONE-ACETAMINOPHEN 5-325 MG PO TABS
1.0000 | ORAL_TABLET | ORAL | Status: DC | PRN
  Administered 2014-05-27: 1 via ORAL

## 2014-05-27 MED ORDER — DIBUCAINE 1 % RE OINT: 1.0000 "application " | TOPICAL_OINTMENT | RECTAL | Status: DC | PRN

## 2014-05-27 MED ORDER — WITCH HAZEL-GLYCERIN EX PADS
1.0000 | MEDICATED_PAD | CUTANEOUS | Status: DC | PRN
Start: 2014-05-27 — End: 2014-05-29

## 2014-05-27 MED ORDER — DIPHENHYDRAMINE HCL 25 MG PO CAPS: 25.0000 mg | ORAL_CAPSULE | Freq: Four times a day (QID) | ORAL | Status: DC | PRN

## 2014-05-27 MED ORDER — OXYTOCIN 40 UNITS IN LACTATED RINGERS INFUSION - SIMPLE MED
62.5000 mL/h | INTRAVENOUS | Status: DC
  Administered 2014-05-27: 62.5 mL/h via INTRAVENOUS
  Filled 2014-05-27: qty 1000

## 2014-05-27 MED ORDER — ZOLPIDEM TARTRATE 5 MG PO TABS
5.0000 mg | ORAL_TABLET | Freq: Every evening | ORAL | Status: DC | PRN
Start: 2014-05-27 — End: 2014-05-29

## 2014-05-27 MED ORDER — CITRIC ACID-SODIUM CITRATE 334-500 MG/5ML PO SOLN: 30.0000 mL | ORAL | Status: DC | PRN

## 2014-05-27 MED ORDER — LACTATED RINGERS IV SOLN: INTRAVENOUS | Status: DC

## 2014-05-27 MED ORDER — LABETALOL HCL 100 MG PO TABS
100.0000 mg | ORAL_TABLET | Freq: Two times a day (BID) | ORAL | Status: DC
  Administered 2014-05-27 – 2014-05-29 (×4): 100 mg via ORAL
  Filled 2014-05-27 (×4): qty 1

## 2014-05-27 MED ORDER — LANOLIN HYDROUS EX OINT: TOPICAL_OINTMENT | CUTANEOUS | Status: DC | PRN

## 2014-05-27 NOTE — MAU Note (Signed)
Contractions started at 0500, now q 2-5. No bleeding or leaking.   4th preg, no problems.

## 2014-05-27 NOTE — Progress Notes (Signed)
Pt. Concerned about bp 155/91 and also wanting to talk to [facility] about labs.  Vicki notified; no epigastric pain, dizziness, ha, etc. Will continue to monitor.  [place] Cellar RN

## 2014-05-27 NOTE — H&P (Signed)
Stacey Hickman is a 43 y.o. female, [phone] at 74 2/7 weeks, presenting for onset of labor at 5am, denies leaking or bleeding, reports + FM.  Cx has been 3 cm in office last week.  BP then 120/80.  Denies HA, visual sx, or epigastric pain.  Has had pedal edema.  Patient Active Problem List   Diagnosis Date Noted  . Hx of preterm delivery, currently pregnant 05/27/2014  . 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 Elderly multigravida with antepartum condition or complication 27/25/3664  . Rh negative state in antepartum period 10/08/2013  . Obesity (BMI 30.0-34.9) 05/28/2013  . Migraine 05/28/2013  . DEPRESSION 08/21/2008    History of present pregnancy: Patient entered care at 11 1/7 weeks.   EDC of 06/01/14 was established by LMP and [place] at 12 5/7 weeks.   Anatomy scan:  20 6/7 weeks, with normal findings and an anterior placenta.   Additional [place] evaluations:   29 5/7 weeks, due to S<D:  EFW 3+6, AFI 18.2, cervix closed, growth WNL 35 5/7 weeks:  EFW 6+11, 78%ile, vtx, AFI 14. HC 43%ile, AC 96%ile. Significant prenatal events:  Had normal [place].  On Zoloft during pregnancy with benefit.  Received Rhogam at 29 5/7 weeks.   FFN negative at 24 6/7 weeks.   Last evaluation:  05/23/14, cervix 3 cm, 70%, vtx, BP 120/80.  OB History   Grav Para Term Preterm Abortions TAB SAB Ect Mult Living   4 3 2 1  0 0 0 0 0 3    2004--39 week, 12 hour labor, 8+2, epidural, delivered by VL 2006--36 weeks, 4 hour labor, 6+12, epidural, baby with Down's, delivered by VL 2008--40 weeks, 6 hour labor, 8+15, delivered by VL  Past Medical History  Diagnosis Date  . Insomnia   . Dizziness   . Tendonitis of foot 11/2010    Right foot  . Left knee injury 03/2011    was in immobilizer  . Headache(784.0)   . Depression     no problems   Past Surgical History  Procedure Laterality Date  . Other surgical history      laproscopic removal of IUD, perfed uterus  . Wisdom tooth  extraction     Family History: family history includes Diabetes in her brother, brother, father, mother, and paternal grandfather; Hyperlipidemia in her brother; Hypertension in her brother; Hypothyroidism in her brother and mother; Obesity in her brother, father, mother, and sister.  Social History:  reports that she has never smoked. She has never used smokeless tobacco. She reports that she drinks alcohol. She reports that she does not use illicit drugs.  Patient is Caucasian, of the [place], graduate educated, employed as Software engineer.  Husband, Clair Gulling, is a Copy.   Prenatal Transfer Tool  Maternal Diabetes: No Genetic Screening: Normal [place] Maternal Ultrasounds/Referrals: Normal Fetal Ultrasounds or other Referrals:  None Maternal Substance Abuse:  No Significant Maternal Medications:  None Significant Maternal Lab Results: Lab values include: Group B Strep negative, Rh negative    ROS:  Contractions, +FM  No Known Allergies   Dilation: 3.5 Effacement (%): 80;90 Station: -1 Exam by:: Cloyde Oregel Blood pressure 149/98, pulse 77, temperature 97.6 F (36.4 C), temperature source Oral, resp. rate 18, height 5\' 3"  (1.6 m), weight 199 lb (90.266 kg).  Chest clear Heart RRR without murmur Abd gravid, NT, FH 38 cm Pelvic: As above Ext: DTR 2+, no clonus, 2+ edema  FHR: Category 1 UCs:  q 3-4 min, moderate  Prenatal labs: ABO, Rh:  A neg Antibody:  neg Rubella:   Immune RPR:   NR HBsAg:   Neg HIV:   NR GBS:  Neg 05/06/14 Sickle cell/Hgb electrophoresis:  NA Pap:  WNL 11/07/13 GC:  Negative  Chlamydia:  Neg 10/08/13 Genetic screenings:  Neg 10/08/13 Glucola:  WNL Other: Hgb 14.3 at NOB, 12.7 at 28 weeks FFN negative at 24 6/7 weeks    Assessment/Plan: IUP at 39 2/7 weeks Early labor Mild elevation of BP--no hx GBS negative Rh negative Desires epidural  Plan: Admit to [place] per consult with Dr. Cletis Media Routine [facility] orders [place] labs  and PCR Epidural prn.  Allena Katz, [place] 05/27/2014, 7:33 AM

## 2014-05-27 NOTE — Lactation Note (Signed)
This note was copied from the chart of [facility]. Lactation Consultation Note  Pump set up in mother's room.  I reviewed its use and instructed mom on how to increase the vacuum on it.  She was also taught had expression and obtained about  0.5 ml of colostrum.  Her areolas and nipples are sensitive.  Color is not uniform.  There are some areas that are reddened and some areas pale.  The perimeter is dark brown.  She reports being prone to yeast infections.  I spoke with Donnel Saxon and she is going to prescribe Lotrimin for her.  Also her flange size was increased to a #30.  Patient Name: Stacey Hickman [id number] Date: 05/27/2014     Maternal Data    Feeding    LATCH Score/Interventions                      Lactation Tools Discussed/Used     Consult Status      Van Clines 05/27/2014, 6:41 PM

## 2014-05-27 NOTE — Progress Notes (Signed)
In to see patient to discuss BP.  Denies HA, visual sx, or epigastric pain. Filed Vitals:   05/27/14 0945 05/27/14 0957 05/27/14 1044 05/27/14 1302  BP: 137/90 144/92 149/90 155/91  Pulse: 72 69 73 75  Temp:   98.5 F (36.9 C) 97.5 F (36.4 C)  TempSrc:   Oral Oral  Resp: 20 16 18 18   Height:      Weight:       Results for orders placed during the hospital encounter of 05/27/14 (from the past 24 hour(s))  RPR     Status: None   Collection Time    05/27/14  7:30 AM      Result Value Ref Range   RPR NON REAC  NON REAC  CBC     Status: Abnormal   Collection Time    05/27/14  7:30 AM      Result Value Ref Range   WBC 9.7  4.0 - 10.5 K/uL   RBC 4.11  3.87 - 5.11 MIL/uL   Hemoglobin 12.1  12.0 - 15.0 g/dL   HCT 35.2 (*) 36.0 - 46.0 %   MCV 85.6  78.0 - 100.0 fL   MCH 29.4  26.0 - 34.0 pg   MCHC 34.4  30.0 - 36.0 g/dL   RDW 13.4  11.5 - 15.5 %   Platelets 149 (*) 150 - 400 K/uL  COMPREHENSIVE METABOLIC PANEL     Status: Abnormal   Collection Time    05/27/14  7:30 AM      Result Value Ref Range   Sodium 136 (*) 137 - 147 mEq/L   Potassium 3.9  3.7 - 5.3 mEq/L   Chloride 103  96 - 112 mEq/L   CO2 19  19 - 32 mEq/L   Glucose, Bld 78  70 - 99 mg/dL   BUN 7  6 - 23 mg/dL   Creatinine, Ser 0.67  0.50 - 1.10 mg/dL   Calcium 8.2 (*) 8.4 - 10.5 mg/dL   Total Protein 5.9 (*) 6.0 - 8.3 g/dL   Albumin 2.4 (*) 3.5 - 5.2 g/dL   AST 18  0 - 37 U/L   ALT 10  0 - 35 U/L   Alkaline Phosphatase 142 (*) 39 - 117 U/L   Total Bilirubin 0.2 (*) 0.3 - 1.2 mg/dL   GFR calc non Af Amer >90  >90 mL/min   GFR calc Af Amer >90  >90 mL/min   Anion gap 14  5 - 15  LACTATE DEHYDROGENASE     Status: None   Collection Time    05/27/14  7:30 AM      Result Value Ref Range   LDH 176  94 - 250 U/L  URIC ACID     Status: None   Collection Time    05/27/14  7:30 AM      Result Value Ref Range   Uric Acid, Serum 5.2  2.4 - 7.0 mg/dL  PROTEIN / CREATININE RATIO, URINE     Status: Abnormal   Collection Time    05/27/14  8:35 AM      Result Value Ref Range   Creatinine, Urine 266.34     Total Protein, Urine 75.8     PROTEIN CREATININE RATIO 0.28 (*) 0.00 - 0.15   Fundus firm, lochia scant, perineum healing. DTR 1-2+, no clonus, 2+ edema.  Plan: Consulted with Dr. Cletis Media. Will CTO BP through the afternoon--if BP still elevated, will start Labetalol 100 mg po BID. Plan  repeat PCR tomorrow.  Donnel Saxon, CNM 05/27/14 2:15p

## 2014-05-28 LAB — CBC
HCT: 31.7 % — ABNORMAL LOW (ref 36.0–46.0)
Hemoglobin: 10.5 g/dL — ABNORMAL LOW (ref 12.0–15.0)
MCH: 28.8 pg (ref 26.0–34.0)
MCHC: 33.1 g/dL (ref 30.0–36.0)
MCV: 86.8 fL (ref 78.0–100.0)
Platelets: 140 10*3/uL — ABNORMAL LOW (ref 150–400)
RBC: 3.65 MIL/uL — ABNORMAL LOW (ref 3.87–5.11)
RDW: 13.5 % (ref 11.5–15.5)
WBC: 11.9 10*3/uL — ABNORMAL HIGH (ref 4.0–10.5)

## 2014-05-28 MED ORDER — HYDROCHLOROTHIAZIDE 25 MG PO TABS
25.0000 mg | ORAL_TABLET | Freq: Every day | ORAL | Status: DC
  Administered 2014-05-28 – 2014-05-29 (×2): 25 mg via ORAL
  Filled 2014-05-28 (×3): qty 1

## 2014-05-28 NOTE — Progress Notes (Signed)
Patient reports one plum sized clot at last urination, bleeding small. Asked her to call RN if clots and/or soaking more than one pad/hour.

## 2014-05-28 NOTE — Progress Notes (Signed)
Subjective: Postpartum Day 1: Vaginal delivery, 1st degree perineal laceration Patient up ad lib, reports no syncope or dizziness.  Has been visiting baby in [place] due to TTN, doing well, off supplemental O2. Has mild HA, denies visual sx, or epigastric pain. Feeding:  Breast Contraceptive plan:  Undecided  Started on Labetalol 100 mg po BID last night--received doses at 1838 and 0541.  Objective: Vital signs in last 24 hours: Temp:  [97.6 F (36.4 C)-98 F (36.7 C)] 97.9 F (36.6 C) (09/15 0846) Pulse Rate:  [68-88] 88 (09/15 1126) Resp:  [18-20] 18 (09/15 0846) BP: (129-158)/(80-94) 135/85 mmHg (09/15 1126)  Filed Vitals:   05/28/14 0015 05/28/14 0550 05/28/14 0846 05/28/14 1126  BP: 144/91 143/89 158/94 135/85  Pulse: 68 75 88 88  Temp: 98 F (36.7 C) 97.9 F (36.6 C) 97.9 F (36.6 C)   TempSrc: Oral Oral Oral   Resp: 18 18 18    Height:      Weight:         Physical Exam:  General: alert Lochia: appropriate Uterine Fundus: firm Perineum: healing well DVT Evaluation: No evidence of DVT seen on physical exam. Negative Homan's sign. Calf/Ankle edema is present, 1-2+, no clonus, DTR 1+  Good urine output.   Recent Labs  05/27/14 0730 05/28/14 0615  HGB 12.1 10.5*  HCT 35.2* 31.7*   PIH labs and PCR yesterday WNL.  Assessment/Plan: Status post vaginal delivery day 1 Gestational hypertension--no need for further labs per Dr. Charlesetta Garibaldi NICU infant--stable Stable involution Continue current care. Rx HCTZ 25 mg po q day x 7 days. Plan for discharge tomorrow    Allena Katz 05/28/2014, 3:02 PM

## 2014-05-28 NOTE — Discharge Summary (Signed)
Vaginal Delivery Discharge Summary  Stacey Hickman  DOB:    November 17, 1970 MRN:    [id number] CSN:    [id number]  Date of admission:                  05/27/14  Date of discharge:                   05/29/14  Procedures this admission:   SVB, repair of 1st degree perineal  Date of Delivery: 05/27/14  Newborn Data:  Live born female  Birth Weight:  APGAR: 38, 8  Home with mother. Name: Stacey Hickman Circumcision Plan: Inpatient  History of Present Illness:  Stacey Hickman is a 43 y.o. female, [phone], who presents at [redacted]w[redacted]d weeks gestation. The patient has been followed at the [facility] and Gynecology division of [facility]. She was admitted onset of labor. Her pregnancy has been complicated by:  Patient Active Problem List   Diagnosis Date Noted  . Thrombocytopenia, unspecified 05/29/2014  . Hx of preterm delivery, currently pregnant 05/27/2014  . Gestational hypertension 05/27/2014  . Vaginal delivery 05/27/2014  . 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 Elderly multigravida with antepartum condition or complication 13/04/6577  . Rh negative state in antepartum period 10/08/2013  . Obesity (BMI 30.0-34.9) 05/28/2013  . Migraine 05/28/2013  . DEPRESSION 08/21/2008     Hospital Course:  Admitted 05/27/14 in active labor.  Negative GBS. Progressed very quickly to delivery. Utilized nothing for pain management.  Delivery was performed by Donnel Saxon, CNM, without complication. Patient and baby tolerated the procedure without difficulty, with 1st degree perineal laceration noted requiring repair, and a 1st degree right periurethral laceration that did not require repair. Infant status was stable and remained in room with mother.  Subsequently, baby was noted to have tachypnea/grunting and was transferred to NICU for further monitoring.  He was continuing to improve on the day of mother's d/c.  Patient had elevated BP on  admission, with normal PIH labs and PCR--she was started on Labetalol 100 mg po BID on day 0 and HCTZ 25 mg po daily x 7 days.  Mother had an otherwise uncomplicated postpartum course, with breast feeding going well. Baby was remaining in NICU at the time of the patient's discharge, but was doing well.  Mom's physical exam was WNL, and she was discharged home in stable condition. Contraception decision was deferred per patient request at this time. She received adequate benefit from po pain medications.   Feeding:  breast  Contraception:  Deferred until later.  Discharge hemoglobin:  Hemoglobin  Date Value Ref Range Status  05/28/2014 10.5* 12.0 - 15.0 g/dL Final     HCT  Date Value Ref Range Status  05/28/2014 31.7* 36.0 - 46.0 % Final    Discharge Physical Exam:   General: alert Lochia: appropriate Uterine Fundus: firm Incision: healing well DVT Evaluation: No evidence of DVT seen on physical exam. Negative Homan's sign. Calf/Ankle edema is present.  Intrapartum Procedures: spontaneous vaginal delivery Postpartum Procedures: none--baby RH negative Complications-Operative and Postpartum: 1st degree perineal laceration and mild gestational hypertension., mild gestational thrombocytopenia  Discharge Diagnoses: Term Pregnancy-delivered and gestational hypertension  Discharge Information:  Activity:           pelvic rest Diet:                routine Medications: Ibuprofen, Percocet and Labetalol 100 mg po BID, HCTZ 25 mg daily to  complete 7 day course. Condition:      stable Instructions:     Discharge to: home  Follow-up Information   Follow up with Delma Officer, [place] On 07/10/2014. (11:30am.  This is your postpartum followup.)    Specialty:  Family Medicine   Contact information:   [address] [place] [address] [phone]       Please follow up. (Call for any questions or concerns.  [facility] RN will see you early next week--office  will call to schedule.)        Donnel Saxon CNM 05/29/2014 8:51 AM

## 2014-05-28 NOTE — Lactation Note (Signed)
This note was copied from the chart of [facility]. Lactation Consultation Note    Follow up consult with this mom  Of a term NICU baby. Momis a very experienced breast feeder. [name] was too sleepy to latch, despite mom trying to do so for 30 minutes. I reivewed pumping and hand expresison with mom. She is a [employer] employee and will be getting a DEP. Mom knows to call for questions/concerns.    Patient Name: Stacey Hickman [id number] Date: 05/28/2014     Maternal Data    Feeding Feeding Type: Bottle Fed - Formula Nipple Type: Slow - flow Length of feed: 5 min  LATCH Score/Interventions Latch: Too sleepy or reluctant, no latch achieved, no sucking elicited. Intervention(s): Skin to skin;Waking techniques  Audible Swallowing: None Intervention(s): Hand expression  Type of Nipple: Everted at rest and after stimulation  Comfort (Breast/Nipple): Soft / non-tender     Hold (Positioning): No assistance needed to correctly position infant at breast.  LATCH Score: 6  Lactation Tools Discussed/Used     Consult Status      Tonna Corner 05/28/2014, 7:41 PM

## 2014-05-28 NOTE — Discharge Instructions (Signed)
Postpartum Care After Vaginal Delivery °After you deliver your newborn (postpartum period), the usual stay in the hospital is 24-72 hours. If there were problems with your labor or delivery, or if you have other medical problems, you might be in the hospital longer.  °While you are in the hospital, you will receive help and instructions on how to care for yourself and your newborn during the postpartum period.  °While you are in the hospital: °· Be sure to tell your nurses if you have pain or discomfort, as well as where you feel the pain and what makes the pain worse. °· If you had an incision made near your vagina (episiotomy) or if you had some tearing during delivery, the nurses may put ice packs on your episiotomy or tear. The ice packs may help to reduce the pain and swelling. °· If you are breastfeeding, you may feel uncomfortable contractions of your uterus for a couple of weeks. This is normal. The contractions help your uterus get back to normal size. °· It is normal to have some bleeding after delivery. °¨ For the first 1-3 days after delivery, the flow is red and the amount may be similar to a period. °¨ It is common for the flow to start and stop. °¨ In the first few days, you may pass some small clots. Let your nurses know if you begin to pass large clots or your flow increases. °¨ Do not  flush blood clots down the toilet before having the nurse look at them. °¨ During the next 3-10 days after delivery, your flow should become more watery and pink or brown-tinged in color. °¨ Ten to fourteen days after delivery, your flow should be a small amount of yellowish-white discharge. °¨ The amount of your flow will decrease over the first few weeks after delivery. Your flow may stop in 6-8 weeks. Most women have had their flow stop by 12 weeks after delivery. °· You should change your sanitary pads frequently. °· Wash your hands thoroughly with soap and water for at least 20 seconds after changing pads, using  the toilet, or before holding or feeding your newborn. °· You should feel like you need to empty your bladder within the first 6-8 hours after delivery. °· In case you become weak, lightheaded, or faint, call your nurse before you get out of bed for the first time and before you take a shower for the first time. °· Within the first few days after delivery, your breasts may begin to feel tender and full. This is called engorgement. Breast tenderness usually goes away within 48-72 hours after engorgement occurs. You may also notice milk leaking from your breasts. If you are not breastfeeding, do not stimulate your breasts. Breast stimulation can make your breasts produce more milk. °· Spending as much time as possible with your newborn is very important. During this time, you and your newborn can feel close and get to know each other. Having your newborn stay in your room (rooming in) will help to strengthen the bond with your newborn.  It will give you time to get to know your newborn and become comfortable caring for your newborn. °· Your hormones change after delivery. Sometimes the hormone changes can temporarily cause you to feel sad or tearful. These feelings should not last more than a few days. If these feelings last longer than that, you should talk to your caregiver. °· If desired, talk to your caregiver about methods of family planning or contraception. °·   Talk to your caregiver about immunizations. Your caregiver may want you to have the following immunizations before leaving the hospital:  Tetanus, diphtheria, and pertussis (Tdap) or tetanus and diphtheria (Td) immunization. It is very important that you and your family (including grandparents) or others caring for your newborn are up-to-date with the Tdap or Td immunizations. The Tdap or Td immunization can help protect your newborn from getting ill.  Rubella immunization.  Varicella (chickenpox) immunization.  Influenza immunization. You should  receive this annual immunization if you did not receive the immunization during your pregnancy. Document Released: 06/27/2007 Document Revised: 05/24/2012 Document Reviewed: 04/26/2012 [facility] Patient Information 2015 [place], [place]. This information is not intended to replace advice given to you by your health care provider. Make sure you discuss any questions you have with your health care provider.  Hypertension During Pregnancy Hypertension, or high blood pressure, is when there is extra pressure inside your blood vessels that carry blood from the heart to the rest of your body (arteries). It can happen at any time in life, including pregnancy. Hypertension during pregnancy can cause problems for you and your baby. Your baby might not weigh as much as he or she should at birth or might be born early (premature). Very bad cases of hypertension during pregnancy can be life-threatening.  Different types of hypertension can occur during pregnancy. These include:  Chronic hypertension. This happens when a woman has hypertension before pregnancy and it continues during pregnancy.  Gestational hypertension. This is when hypertension develops during pregnancy.  Preeclampsia or toxemia of pregnancy. This is a very serious type of hypertension that develops only during pregnancy. It affects the whole body and can be very dangerous for both mother and baby.  Gestational hypertension and preeclampsia usually go away after your baby is born. Your blood pressure will likely stabilize within 6 weeks. Women who have hypertension during pregnancy have a greater chance of developing hypertension later in life or with future pregnancies. RISK FACTORS There are certain factors that make it more likely for you to develop hypertension during pregnancy. These include:  Having hypertension before pregnancy.  Having hypertension during a previous pregnancy.  Being overweight.  Being older than 40  years.  Being pregnant with more than one baby.  Having diabetes or kidney problems. SIGNS AND SYMPTOMS Chronic and gestational hypertension rarely cause symptoms. Preeclampsia has symptoms, which may include:  Increased protein in your urine. Your health care provider will check for this at every prenatal visit.  Swelling of your hands and face.  Rapid weight gain.  Headaches.  Visual changes.  Being bothered by light.  Abdominal pain, especially in the upper right area.  Chest pain.  Shortness of breath.  Increased reflexes.  Seizures. These occur with a more severe form of preeclampsia, called eclampsia. DIAGNOSIS  You may be diagnosed with hypertension during a regular prenatal exam. At each prenatal visit, you may have:  Your blood pressure checked.  A urine test to check for protein in your urine. The type of hypertension you are diagnosed with depends on when you developed it. It also depends on your specific blood pressure reading.  Developing hypertension before 20 weeks of pregnancy is consistent with chronic hypertension.  Developing hypertension after 20 weeks of pregnancy is consistent with gestational hypertension.  Hypertension with increased urinary protein is diagnosed as preeclampsia.  Blood pressure measurements that stay above 893 systolic or 810 diastolic are a sign of severe preeclampsia. TREATMENT Treatment for hypertension during pregnancy  varies. Treatment depends on the type of hypertension and how serious it is.  If you take medicine for chronic hypertension, you may need to switch medicines.  Medicines called ACE inhibitors should not be taken during pregnancy.  Low-dose aspirin may be suggested for women who have risk factors for preeclampsia.  If you have gestational hypertension, you may need to take a blood pressure medicine that is safe during pregnancy. Your health care provider will recommend the correct medicine.  If you have  severe preeclampsia, you may need to be in the hospital. Health care providers will watch you and your baby very closely. You also may need to take medicine called magnesium sulfate to prevent seizures and lower blood pressure.  Sometimes, an early delivery is needed. This may be the case if the condition worsens. It would be done to protect you and your baby. The only cure for preeclampsia is delivery.  Your health care provider may recommend that you take one low-dose aspirin (81 mg) each day to help prevent high blood pressure during your pregnancy if you are at risk for preeclampsia. You may be at risk for preeclampsia if:  You had preeclampsia or eclampsia during a previous pregnancy.  Your baby did not grow as expected during a previous pregnancy.  You experienced preterm birth with a previous pregnancy.  You experienced a separation of the placenta from the uterus (placental abruption) during a previous pregnancy.  You experienced the loss of your baby during a previous pregnancy.  You are pregnant with more than one baby.  You have other medical conditions, such as diabetes or an autoimmune disease. HOME CARE INSTRUCTIONS  Schedule and keep all of your regular prenatal care appointments. This is important.  Take medicines only as directed by your health care provider. Tell your health care provider about all medicines you take.  Eat as little salt as possible.  Get regular exercise.  Do not drink alcohol.  Do not use tobacco products.  Do not drink products with caffeine.  Lie on your left side when resting. SEEK IMMEDIATE MEDICAL CARE IF:  You have severe abdominal pain.  You have sudden swelling in your hands, ankles, or face.  You gain 4 pounds (1.8 kg) or more in 1 week.  You vomit repeatedly.  You have vaginal bleeding.  You have a headache.  You have blurred or double vision.  You have muscle twitching or spasms.  You have shortness of  breath.  You have blue fingernails or lips.  You have blood in your urine. MAKE SURE YOU:  Understand these instructions.  Will watch your condition.  Will get help right away if you are not doing well or get worse. Document Released: 05/18/2011 Document Revised: 01/14/2014 Document Reviewed: 03/29/2013 [facility] Patient Information 2015 [place], [place]. This information is not intended to replace advice given to you by your health care provider. Make sure you discuss any questions you have with your health care provider.

## 2014-05-29 DIAGNOSIS — D696 Thrombocytopenia, unspecified: Secondary | ICD-10-CM | POA: Diagnosis present

## 2014-05-29 MED ORDER — LABETALOL HCL 100 MG PO TABS
100.0000 mg | ORAL_TABLET | Freq: Two times a day (BID) | ORAL | Status: DC
Start: 2014-05-29 — End: 2014-08-30

## 2014-05-29 MED ORDER — HYDROCHLOROTHIAZIDE 25 MG PO TABS: 25.0000 mg | ORAL_TABLET | Freq: Every day | ORAL | Status: DC

## 2014-05-29 MED ORDER — OXYCODONE-ACETAMINOPHEN 5-325 MG PO TABS: 1.0000 | ORAL_TABLET | ORAL | Status: DC | PRN

## 2014-05-29 MED ORDER — IBUPROFEN 600 MG PO TABS: 600.0000 mg | ORAL_TABLET | Freq: Four times a day (QID) | ORAL | Status: AC

## 2014-05-30 ENCOUNTER — Ambulatory Visit: Payer: Self-pay

## 2014-05-30 NOTE — Lactation Note (Signed)
This note was copied from the chart of [facility]. Lactation Consultation Note     Follow up consult with this mom of a term baby, in NICU, being discharged to home today after rooming in with mom in NICU last night. Mom reports her milk is transitioning in, and she hears swallow with feeding. The baby was offered a bottle of formula  Twice during the night, but only bottle fed once. Mom was given comfort gels for tender nipples - she originally set the DEP suction to high - mom instructed in their care and use. Mom is aware to call lactation for questions/concerns or o/p appointment after discharge, as needed.  Patient Name: Stacey Hickman WCHEN'I Date: 05/30/2014 Reason for consult: Follow-up assessment;NICU baby   Maternal Data    Feeding Feeding Type: Breast Milk Length of feed: 40 min  LATCH Score/Interventions                      Lactation Tools Discussed/Used     Consult Status Consult Status: Complete Follow-up type: Call as needed    Tonna Corner 05/30/2014, 12:13 PM

## 2014-07-15 ENCOUNTER — Encounter (HOSPITAL_COMMUNITY): Payer: Self-pay | Admitting: *Deleted

## 2014-07-23 ENCOUNTER — Other Ambulatory Visit: Payer: Self-pay | Admitting: Family Medicine

## 2014-07-24 ENCOUNTER — Other Ambulatory Visit: Payer: Self-pay

## 2014-07-24 MED ORDER — SUMATRIPTAN SUCCINATE 100 MG PO TABS: ORAL_TABLET | ORAL | Status: DC

## 2014-08-30 ENCOUNTER — Ambulatory Visit (INDEPENDENT_AMBULATORY_CARE_PROVIDER_SITE_OTHER): Payer: 59 | Admitting: Family Medicine

## 2014-08-30 ENCOUNTER — Encounter: Payer: Self-pay | Admitting: Family Medicine

## 2014-08-30 VITALS — BP 120/80 | HR 85 | Temp 98.2°F | Ht 63.0 in | Wt 173.6 lb

## 2014-08-30 DIAGNOSIS — F329 Major depressive disorder, single episode, unspecified: Secondary | ICD-10-CM

## 2014-08-30 DIAGNOSIS — F32A Depression, unspecified: Secondary | ICD-10-CM

## 2014-08-30 DIAGNOSIS — E559 Vitamin D deficiency, unspecified: Secondary | ICD-10-CM

## 2014-08-30 DIAGNOSIS — Z0000 Encounter for general adult medical examination without abnormal findings: Secondary | ICD-10-CM

## 2014-08-30 DIAGNOSIS — D508 Other iron deficiency anemias: Secondary | ICD-10-CM

## 2014-08-30 MED ORDER — SERTRALINE HCL 100 MG PO TABS: ORAL_TABLET | ORAL | Status: DC

## 2014-08-30 NOTE — Progress Notes (Signed)
Subjective:     Stacey Hickman is a 43 y.o. female and is here for a comprehensive physical exam. The patient reports no problems.  History   Social History  . Marital Status: Married    Spouse Name: N/A    Number of Children: 3  . Years of Education: N/A   Occupational History  . PHARMACIST [employer]   Social History Main Topics  . Smoking status: Never Smoker   . Smokeless tobacco: Never Used  . Alcohol Use: Yes     Comment: occ, not with preg  . Drug Use: No  . Sexual Activity:    Partners: Male   Other Topics Concern  . Not on file   Social History Narrative   Exercising--  30 min 2x a week--- walking   Health Maintenance  Topic Date Due  . INFLUENZA VACCINE  04/14/2015  . PAP SMEAR  07/10/2017  . TETANUS/TDAP  05/28/2024    The following portions of the patient's history were reviewed and updated as appropriate:  She  has a past medical history of Insomnia; Dizziness; Tendonitis of foot (11/2010); Left knee injury (03/2011); Headache(784.0); Depression; and Gestational hypertension (05/27/2014). She  does not have any pertinent problems on file. She  has past surgical history that includes Other surgical history; Wisdom tooth extraction; and IUD removal. Her family history includes Diabetes in her brother, brother, father, mother, and paternal grandfather; Hyperlipidemia in her brother; Hypertension in her brother; Hypothyroidism in her brother and mother; Obesity in her brother, father, mother, and sister. She  reports that she has never smoked. She has never used smokeless tobacco. She reports that she drinks alcohol. She reports that she does not use illicit drugs. She has a current medication list which includes the following prescription(s): fiber, NONFORMULARY OR COMPOUNDED ITEM, ibuprofen, prenatal multivitamin, sertraline, and sumatriptan. Current Outpatient Prescriptions on File Prior to Visit  Medication Sig Dispense Refill  . Calcium Polycarbophil (FIBER)  625 MG TABS Take 1 tablet by mouth as needed.      Marland Kitchen ibuprofen (ADVIL,MOTRIN) 600 MG tablet Take 1 tablet (600 mg total) by mouth every 6 (six) hours. 30 tablet 0  . Prenatal Vit-Fe Fumarate-FA (PRENATAL MULTIVITAMIN) TABS tablet Take 1 tablet by mouth daily at 12 noon.    . sertraline (ZOLOFT) 100 MG tablet Take 1 tablet (100 mg total) by mouth daily. 30 tablet 3  . SUMAtriptan (IMITREX) 100 MG tablet Take 1 tablet as needed for Migraine and repeat in 2 hours if needed--Office visit due for more refills. 10 tablet 0   No current facility-administered medications on file prior to visit.   She has No Known Allergies...  Review of Systems Review of Systems  Constitutional: Negative for activity change, appetite change and fatigue.  HENT: Negative for hearing loss, congestion, tinnitus and ear discharge.  dentist q40m Eyes: Negative for visual disturbance (see optho q1y -- vision corrected to 20/20 with glasses).  Respiratory: Negative for cough, chest tightness and shortness of breath.   Cardiovascular: Negative for chest pain, palpitations and leg swelling.  Gastrointestinal: Negative for abdominal pain, diarrhea, constipation and abdominal distention.  Genitourinary: Negative for urgency, frequency, decreased urine volume and difficulty urinating.  Musculoskeletal: Negative for back pain, arthralgias and gait problem.  Skin: Negative for color change, pallor and rash.  Neurological: Negative for dizziness, light-headedness, numbness and headaches.  Hematological: Negative for adenopathy. Does not bruise/bleed easily.  Psychiatric/Behavioral: Negative for suicidal ideas, confusion, sleep disturbance, self-injury, dysphoric mood, decreased concentration  and agitation.       Objective:    BP 120/80 mmHg  Pulse 85  Temp(Src) 98.2 F (36.8 C) (Oral)  Ht 5\' 3"  (1.6 m)  Wt 173 lb 9.6 oz (78.744 kg)  BMI 30.76 kg/m2  SpO2 98%  Breastfeeding? Yes General appearance: alert, cooperative,  appears stated age and no distress Head: Normocephalic, without obvious abnormality, atraumatic Eyes: conjunctivae/corneas clear. PERRL, EOM's intact. Fundi benign. Ears: normal TM's and external ear canals both ears Nose: Nares normal. Septum midline. Mucosa normal. No drainage or sinus tenderness. Throat: lips, mucosa, and tongue normal; teeth and gums normal Neck: no adenopathy, no carotid bruit, no JVD, supple, symmetrical, trachea midline and thyroid not enlarged, symmetric, no tenderness/mass/nodules Back: symmetric, no curvature. ROM normal. No CVA tenderness. Lungs: clear to auscultation bilaterally Breasts: gyn Heart: S1, S2 normal Abdomen: soft, non-tender; bowel sounds normal; no masses,  no organomegaly Pelvic: deferred --gyn Extremities: extremities normal, atraumatic, no cyanosis or edema Pulses: 2+ and symmetric Skin: Skin color, texture, turgor normal. No rashes or lesions Lymph nodes: Cervical, supraclavicular, and axillary nodes normal. Neurologic: Alert and oriented X 3, normal strength and tone. Normal symmetric reflexes. Normal coordination and gait Psych- no depression, no anxiety      Assessment:    Healthy female exam.       Plan:    ghm utd Check labs See After Visit Summary for Counseling Recommendations    1. Preventative health care  - Basic metabolic panel; Future - CBC with Differential; Future - Hepatic function panel; Future - Lipid panel; Future - POCT urinalysis dipstick; Future - TSH; Future - Vitamin B12; Future - Vitamin D 1,25 dihydroxy; Future - IBC panel; Future - Ferritin; Future  2. Other iron deficiency anemias Check labs - CBC with Differential; Future - IBC panel; Future - Ferritin; Future  3. Vitamin D deficiency   - Vitamin D 1,25 dihydroxy; Future  4. Depression  - sertraline (ZOLOFT) 100 MG tablet; 1 1/2 tab po qd  Dispense: 135 tablet; Refill: 3

## 2014-08-30 NOTE — Progress Notes (Signed)
Pre visit review using our clinic review tool, if applicable. No additional management support is needed unless otherwise documented below in the visit note. 

## 2014-08-30 NOTE — Patient Instructions (Signed)
Preventive Care for Adults A healthy lifestyle and preventive care can promote health and wellness. Preventive health guidelines for women include the following key practices.  A routine yearly physical is a good way to check with your health care provider about your health and preventive screening. It is a chance to share any concerns and updates on your health and to receive a thorough exam.  Visit your dentist for a routine exam and preventive care every 6 months. Brush your teeth twice a day and floss once a day. Good oral hygiene prevents tooth decay and gum disease.  The frequency of eye exams is based on your age, health, family medical history, use of contact lenses, and other factors. Follow your health care provider's recommendations for frequency of eye exams.  Eat a healthy diet. Foods like vegetables, fruits, whole grains, low-fat dairy products, and lean protein foods contain the nutrients you need without too many calories. Decrease your intake of foods high in solid fats, added sugars, and salt. Eat the right amount of calories for you.Get information about a proper diet from your health care provider, if necessary.  Regular physical exercise is one of the most important things you can do for your health. Most adults should get at least 150 minutes of moderate-intensity exercise (any activity that increases your heart rate and causes you to sweat) each week. In addition, most adults need muscle-strengthening exercises on 2 or more days a week.  Maintain a healthy weight. The body mass index (BMI) is a screening tool to identify possible weight problems. It provides an estimate of body fat based on height and weight. Your health care provider can find your BMI and can help you achieve or maintain a healthy weight.For adults 20 years and older:  A BMI below 18.5 is considered underweight.  A BMI of 18.5 to 24.9 is normal.  A BMI of 25 to 29.9 is considered overweight.  A BMI of  30 and above is considered obese.  Maintain normal blood lipids and cholesterol levels by exercising and minimizing your intake of saturated fat. Eat a balanced diet with plenty of fruit and vegetables. Blood tests for lipids and cholesterol should begin at age 76 and be repeated every 5 years. If your lipid or cholesterol levels are high, you are over 50, or you are at high risk for heart disease, you may need your cholesterol levels checked more frequently.Ongoing high lipid and cholesterol levels should be treated with medicines if diet and exercise are not working.  If you smoke, find out from your health care provider how to quit. If you do not use tobacco, do not start.  Lung cancer screening is recommended for adults aged 22-80 years who are at high risk for developing lung cancer because of a history of smoking. A yearly low-dose CT scan of the lungs is recommended for people who have at least a 30-pack-year history of smoking and are a current smoker or have quit within the past 15 years. A pack year of smoking is smoking an average of 1 pack of cigarettes a day for 1 year (for example: 1 pack a day for 30 years or 2 packs a day for 15 years). Yearly screening should continue until the smoker has stopped smoking for at least 15 years. Yearly screening should be stopped for people who develop a health problem that would prevent them from having lung cancer treatment.  If you are pregnant, do not drink alcohol. If you are breastfeeding,  be very cautious about drinking alcohol. If you are not pregnant and choose to drink alcohol, do not have more than 1 drink per day. One drink is considered to be 12 ounces (355 mL) of beer, 5 ounces (148 mL) of wine, or 1.5 ounces (44 mL) of liquor.  Avoid use of street drugs. Do not share needles with anyone. Ask for help if you need support or instructions about stopping the use of drugs.  High blood pressure causes heart disease and increases the risk of  stroke. Your blood pressure should be checked at least every 1 to 2 years. Ongoing high blood pressure should be treated with medicines if weight loss and exercise do not work.  If you are 75-52 years old, ask your health care provider if you should take aspirin to prevent strokes.  Diabetes screening involves taking a blood sample to check your fasting blood sugar level. This should be done once every 3 years, after age 15, if you are within normal weight and without risk factors for diabetes. Testing should be considered at a younger age or be carried out more frequently if you are overweight and have at least 1 risk factor for diabetes.  Breast cancer screening is essential preventive care for women. You should practice "breast self-awareness." This means understanding the normal appearance and feel of your breasts and may include breast self-examination. Any changes detected, no matter how small, should be reported to a health care provider. Women in their 58s and 30s should have a clinical breast exam (CBE) by a health care provider as part of a regular health exam every 1 to 3 years. After age 16, women should have a CBE every year. Starting at age 53, women should consider having a mammogram (breast X-ray test) every year. Women who have a family history of breast cancer should talk to their health care provider about genetic screening. Women at a high risk of breast cancer should talk to their health care providers about having an MRI and a mammogram every year.  Breast cancer gene (BRCA)-related cancer risk assessment is recommended for women who have family members with BRCA-related cancers. BRCA-related cancers include breast, ovarian, tubal, and peritoneal cancers. Having family members with these cancers may be associated with an increased risk for harmful changes (mutations) in the breast cancer genes BRCA1 and BRCA2. Results of the assessment will determine the need for genetic counseling and  BRCA1 and BRCA2 testing.  Routine pelvic exams to screen for cancer are no longer recommended for nonpregnant women who are considered low risk for cancer of the pelvic organs (ovaries, uterus, and vagina) and who do not have symptoms. Ask your health care provider if a screening pelvic exam is right for you.  If you have had past treatment for cervical cancer or a condition that could lead to cancer, you need Pap tests and screening for cancer for at least 20 years after your treatment. If Pap tests have been discontinued, your risk factors (such as having a new sexual partner) need to be reassessed to determine if screening should be resumed. Some women have medical problems that increase the chance of getting cervical cancer. In these cases, your health care provider may recommend more frequent screening and Pap tests.  The HPV test is an additional test that may be used for cervical cancer screening. The HPV test looks for the virus that can cause the cell changes on the cervix. The cells collected during the Pap test can be  tested for HPV. The HPV test could be used to screen women aged 30 years and older, and should be used in women of any age who have unclear Pap test results. After the age of 30, women should have HPV testing at the same frequency as a Pap test.  Colorectal cancer can be detected and often prevented. Most routine colorectal cancer screening begins at the age of 50 years and continues through age 75 years. However, your health care provider may recommend screening at an earlier age if you have risk factors for colon cancer. On a yearly basis, your health care provider may provide home test kits to check for hidden blood in the stool. Use of a small camera at the end of a tube, to directly examine the colon (sigmoidoscopy or colonoscopy), can detect the earliest forms of colorectal cancer. Talk to your health care provider about this at age 50, when routine screening begins. Direct  exam of the colon should be repeated every 5-10 years through age 75 years, unless early forms of pre-cancerous polyps or small growths are found.  People who are at an increased risk for hepatitis B should be screened for this virus. You are considered at high risk for hepatitis B if:  You were born in a country where hepatitis B occurs often. Talk with your health care provider about which countries are considered high risk.  Your parents were born in a high-risk country and you have not received a shot to protect against hepatitis B (hepatitis B vaccine).  You have HIV or AIDS.  You use needles to inject street drugs.  You live with, or have sex with, someone who has hepatitis B.  You get hemodialysis treatment.  You take certain medicines for conditions like cancer, organ transplantation, and autoimmune conditions.  Hepatitis C blood testing is recommended for all people born from 1945 through 1965 and any individual with known risks for hepatitis C.  Practice safe sex. Use condoms and avoid high-risk sexual practices to reduce the spread of sexually transmitted infections (STIs). STIs include gonorrhea, chlamydia, syphilis, trichomonas, herpes, HPV, and human immunodeficiency virus (HIV). Herpes, HIV, and HPV are viral illnesses that have no cure. They can result in disability, cancer, and death.  You should be screened for sexually transmitted illnesses (STIs) including gonorrhea and chlamydia if:  You are sexually active and are younger than 24 years.  You are older than 24 years and your health care provider tells you that you are at risk for this type of infection.  Your sexual activity has changed since you were last screened and you are at an increased risk for chlamydia or gonorrhea. Ask your health care provider if you are at risk.  If you are at risk of being infected with HIV, it is recommended that you take a prescription medicine daily to prevent HIV infection. This is  called preexposure prophylaxis (PrEP). You are considered at risk if:  You are a heterosexual woman, are sexually active, and are at increased risk for HIV infection.  You take drugs by injection.  You are sexually active with a partner who has HIV.  Talk with your health care provider about whether you are at high risk of being infected with HIV. If you choose to begin PrEP, you should first be tested for HIV. You should then be tested every 3 months for as long as you are taking PrEP.  Osteoporosis is a disease in which the bones lose minerals and strength   with aging. This can result in serious bone fractures or breaks. The risk of osteoporosis can be identified using a bone density scan. Women ages 65 years and over and women at risk for fractures or osteoporosis should discuss screening with their health care providers. Ask your health care provider whether you should take a calcium supplement or vitamin D to reduce the rate of osteoporosis.  Menopause can be associated with physical symptoms and risks. Hormone replacement therapy is available to decrease symptoms and risks. You should talk to your health care provider about whether hormone replacement therapy is right for you.  Use sunscreen. Apply sunscreen liberally and repeatedly throughout the day. You should seek shade when your shadow is shorter than you. Protect yourself by wearing long sleeves, pants, a wide-brimmed hat, and sunglasses year round, whenever you are outdoors.  Once a month, do a whole body skin exam, using a mirror to look at the skin on your back. Tell your health care provider of new moles, moles that have irregular borders, moles that are larger than a pencil eraser, or moles that have changed in shape or color.  Stay current with required vaccines (immunizations).  Influenza vaccine. All adults should be immunized every year.  Tetanus, diphtheria, and acellular pertussis (Td, Tdap) vaccine. Pregnant women should  receive 1 dose of Tdap vaccine during each pregnancy. The dose should be obtained regardless of the length of time since the last dose. Immunization is preferred during the 27th-36th week of gestation. An adult who has not previously received Tdap or who does not know her vaccine status should receive 1 dose of Tdap. This initial dose should be followed by tetanus and diphtheria toxoids (Td) booster doses every 10 years. Adults with an unknown or incomplete history of completing a 3-dose immunization series with Td-containing vaccines should begin or complete a primary immunization series including a Tdap dose. Adults should receive a Td booster every 10 years.  Varicella vaccine. An adult without evidence of immunity to varicella should receive 2 doses or a second dose if she has previously received 1 dose. Pregnant females who do not have evidence of immunity should receive the first dose after pregnancy. This first dose should be obtained before leaving the health care facility. The second dose should be obtained 4-8 weeks after the first dose.  Human papillomavirus (HPV) vaccine. Females aged 13-26 years who have not received the vaccine previously should obtain the 3-dose series. The vaccine is not recommended for use in pregnant females. However, pregnancy testing is not needed before receiving a dose. If a female is found to be pregnant after receiving a dose, no treatment is needed. In that case, the remaining doses should be delayed until after the pregnancy. Immunization is recommended for any person with an immunocompromised condition through the age of 26 years if she did not get any or all doses earlier. During the 3-dose series, the second dose should be obtained 4-8 weeks after the first dose. The third dose should be obtained 24 weeks after the first dose and 16 weeks after the second dose.  Zoster vaccine. One dose is recommended for adults aged 60 years or older unless certain conditions are  present.  Measles, mumps, and rubella (MMR) vaccine. Adults born before 1957 generally are considered immune to measles and mumps. Adults born in 1957 or later should have 1 or more doses of MMR vaccine unless there is a contraindication to the vaccine or there is laboratory evidence of immunity to   each of the three diseases. A routine second dose of MMR vaccine should be obtained at least 28 days after the first dose for students attending postsecondary schools, health care workers, or international travelers. People who received inactivated measles vaccine or an unknown type of measles vaccine during 1963-1967 should receive 2 doses of MMR vaccine. People who received inactivated mumps vaccine or an unknown type of mumps vaccine before 1979 and are at high risk for mumps infection should consider immunization with 2 doses of MMR vaccine. For females of childbearing age, rubella immunity should be determined. If there is no evidence of immunity, females who are not pregnant should be vaccinated. If there is no evidence of immunity, females who are pregnant should delay immunization until after pregnancy. Unvaccinated health care workers born before 1957 who lack laboratory evidence of measles, mumps, or rubella immunity or laboratory confirmation of disease should consider measles and mumps immunization with 2 doses of MMR vaccine or rubella immunization with 1 dose of MMR vaccine.  Pneumococcal 13-valent conjugate (PCV13) vaccine. When indicated, a person who is uncertain of her immunization history and has no record of immunization should receive the PCV13 vaccine. An adult aged 19 years or older who has certain medical conditions and has not been previously immunized should receive 1 dose of PCV13 vaccine. This PCV13 should be followed with a dose of pneumococcal polysaccharide (PPSV23) vaccine. The PPSV23 vaccine dose should be obtained at least 8 weeks after the dose of PCV13 vaccine. An adult aged 19  years or older who has certain medical conditions and previously received 1 or more doses of PPSV23 vaccine should receive 1 dose of PCV13. The PCV13 vaccine dose should be obtained 1 or more years after the last PPSV23 vaccine dose.  Pneumococcal polysaccharide (PPSV23) vaccine. When PCV13 is also indicated, PCV13 should be obtained first. All adults aged 65 years and older should be immunized. An adult younger than age 65 years who has certain medical conditions should be immunized. Any person who resides in a nursing home or long-term care facility should be immunized. An adult smoker should be immunized. People with an immunocompromised condition and certain other conditions should receive both PCV13 and PPSV23 vaccines. People with human immunodeficiency virus (HIV) infection should be immunized as soon as possible after diagnosis. Immunization during chemotherapy or radiation therapy should be avoided. Routine use of PPSV23 vaccine is not recommended for American Indians, [place] Natives, or people younger than 65 years unless there are medical conditions that require PPSV23 vaccine. When indicated, people who have unknown immunization and have no record of immunization should receive PPSV23 vaccine. One-time revaccination 5 years after the first dose of PPSV23 is recommended for people aged 19-64 years who have chronic kidney failure, nephrotic syndrome, asplenia, or immunocompromised conditions. People who received 1-2 doses of PPSV23 before age 65 years should receive another dose of PPSV23 vaccine at age 65 years or later if at least 5 years have passed since the previous dose. Doses of PPSV23 are not needed for people immunized with PPSV23 at or after age 65 years.  Meningococcal vaccine. Adults with asplenia or persistent complement component deficiencies should receive 2 doses of quadrivalent meningococcal conjugate (MenACWY-D) vaccine. The doses should be obtained at least 2 months apart.  Microbiologists working with certain meningococcal bacteria, military recruits, people at risk during an outbreak, and people who travel to or live in countries with a high rate of meningitis should be immunized. A first-year college student up through age   21 years who is living in a residence hall should receive a dose if she did not receive a dose on or after her 16th birthday. Adults who have certain high-risk conditions should receive one or more doses of vaccine.  Hepatitis A vaccine. Adults who wish to be protected from this disease, have certain high-risk conditions, work with hepatitis A-infected animals, work in hepatitis A research labs, or travel to or work in countries with a high rate of hepatitis A should be immunized. Adults who were previously unvaccinated and who anticipate close contact with an international adoptee during the first 60 days after arrival in the [place] from a country with a high rate of hepatitis A should be immunized.  Hepatitis B vaccine. Adults who wish to be protected from this disease, have certain high-risk conditions, may be exposed to blood or other infectious body fluids, are household contacts or sex partners of hepatitis B positive people, are clients or workers in certain care facilities, or travel to or work in countries with a high rate of hepatitis B should be immunized.  Haemophilus influenzae type b (Hib) vaccine. A previously unvaccinated person with asplenia or sickle cell disease or having a scheduled splenectomy should receive 1 dose of Hib vaccine. Regardless of previous immunization, a recipient of a hematopoietic stem cell transplant should receive a 3-dose series 6-12 months after her successful transplant. Hib vaccine is not recommended for adults with HIV infection. Preventive Services / Frequency Ages 64 to 68 years  Blood pressure check.** / Every 1 to 2 years.  Lipid and cholesterol check.** / Every 5 years beginning at age  22.  Clinical breast exam.** / Every 3 years for women in their 88s and 53s.  BRCA-related cancer risk assessment.** / For women who have family members with a BRCA-related cancer (breast, ovarian, tubal, or peritoneal cancers).  Pap test.** / Every 2 years from ages 90 through 51. Every 3 years starting at age 21 through age 56 or 3 with a history of 3 consecutive normal Pap tests.  HPV screening.** / Every 3 years from ages 24 through ages 1 to 46 with a history of 3 consecutive normal Pap tests.  Hepatitis C blood test.** / For any individual with known risks for hepatitis C.  Skin self-exam. / Monthly.  Influenza vaccine. / Every year.  Tetanus, diphtheria, and acellular pertussis (Tdap, Td) vaccine.** / Consult your health care provider. Pregnant women should receive 1 dose of Tdap vaccine during each pregnancy. 1 dose of Td every 10 years.  Varicella vaccine.** / Consult your health care provider. Pregnant females who do not have evidence of immunity should receive the first dose after pregnancy.  HPV vaccine. / 3 doses over 6 months, if 72 and younger. The vaccine is not recommended for use in pregnant females. However, pregnancy testing is not needed before receiving a dose.  Measles, mumps, rubella (MMR) vaccine.** / You need at least 1 dose of MMR if you were born in 1957 or later. You may also need a 2nd dose. For females of childbearing age, rubella immunity should be determined. If there is no evidence of immunity, females who are not pregnant should be vaccinated. If there is no evidence of immunity, females who are pregnant should delay immunization until after pregnancy.  Pneumococcal 13-valent conjugate (PCV13) vaccine.** / Consult your health care provider.  Pneumococcal polysaccharide (PPSV23) vaccine.** / 1 to 2 doses if you smoke cigarettes or if you have certain conditions.  Meningococcal vaccine.** /  1 dose if you are age 19 to 21 years and a first-year college  student living in a residence hall, or have one of several medical conditions, you need to get vaccinated against meningococcal disease. You may also need additional booster doses.  Hepatitis A vaccine.** / Consult your health care provider.  Hepatitis B vaccine.** / Consult your health care provider.  Haemophilus influenzae type b (Hib) vaccine.** / Consult your health care provider. Ages 40 to 64 years  Blood pressure check.** / Every 1 to 2 years.  Lipid and cholesterol check.** / Every 5 years beginning at age 20 years.  Lung cancer screening. / Every year if you are aged 55-80 years and have a 30-pack-year history of smoking and currently smoke or have quit within the past 15 years. Yearly screening is stopped once you have quit smoking for at least 15 years or develop a health problem that would prevent you from having lung cancer treatment.  Clinical breast exam.** / Every year after age 40 years.  BRCA-related cancer risk assessment.** / For women who have family members with a BRCA-related cancer (breast, ovarian, tubal, or peritoneal cancers).  Mammogram.** / Every year beginning at age 40 years and continuing for as long as you are in good health. Consult with your health care provider.  Pap test.** / Every 3 years starting at age 30 years through age 65 or 70 years with a history of 3 consecutive normal Pap tests.  HPV screening.** / Every 3 years from ages 30 years through ages 65 to 70 years with a history of 3 consecutive normal Pap tests.  Fecal occult blood test (FOBT) of stool. / Every year beginning at age 50 years and continuing until age 75 years. You may not need to do this test if you get a colonoscopy every 10 years.  Flexible sigmoidoscopy or colonoscopy.** / Every 5 years for a flexible sigmoidoscopy or every 10 years for a colonoscopy beginning at age 50 years and continuing until age 75 years.  Hepatitis C blood test.** / For all people born from 1945 through  1965 and any individual with known risks for hepatitis C.  Skin self-exam. / Monthly.  Influenza vaccine. / Every year.  Tetanus, diphtheria, and acellular pertussis (Tdap/Td) vaccine.** / Consult your health care provider. Pregnant women should receive 1 dose of Tdap vaccine during each pregnancy. 1 dose of Td every 10 years.  Varicella vaccine.** / Consult your health care provider. Pregnant females who do not have evidence of immunity should receive the first dose after pregnancy.  Zoster vaccine.** / 1 dose for adults aged 60 years or older.  Measles, mumps, rubella (MMR) vaccine.** / You need at least 1 dose of MMR if you were born in 1957 or later. You may also need a 2nd dose. For females of childbearing age, rubella immunity should be determined. If there is no evidence of immunity, females who are not pregnant should be vaccinated. If there is no evidence of immunity, females who are pregnant should delay immunization until after pregnancy.  Pneumococcal 13-valent conjugate (PCV13) vaccine.** / Consult your health care provider.  Pneumococcal polysaccharide (PPSV23) vaccine.** / 1 to 2 doses if you smoke cigarettes or if you have certain conditions.  Meningococcal vaccine.** / Consult your health care provider.  Hepatitis A vaccine.** / Consult your health care provider.  Hepatitis B vaccine.** / Consult your health care provider.  Haemophilus influenzae type b (Hib) vaccine.** / Consult your health care provider. Ages 65   years and over  Blood pressure check.** / Every 1 to 2 years.  Lipid and cholesterol check.** / Every 5 years beginning at age 22 years.  Lung cancer screening. / Every year if you are aged 73-80 years and have a 30-pack-year history of smoking and currently smoke or have quit within the past 15 years. Yearly screening is stopped once you have quit smoking for at least 15 years or develop a health problem that would prevent you from having lung cancer  treatment.  Clinical breast exam.** / Every year after age 4 years.  BRCA-related cancer risk assessment.** / For women who have family members with a BRCA-related cancer (breast, ovarian, tubal, or peritoneal cancers).  Mammogram.** / Every year beginning at age 40 years and continuing for as long as you are in good health. Consult with your health care provider.  Pap test.** / Every 3 years starting at age 9 years through age 34 or 91 years with 3 consecutive normal Pap tests. Testing can be stopped between 65 and 70 years with 3 consecutive normal Pap tests and no abnormal Pap or HPV tests in the past 10 years.  HPV screening.** / Every 3 years from ages 57 years through ages 64 or 45 years with a history of 3 consecutive normal Pap tests. Testing can be stopped between 65 and 70 years with 3 consecutive normal Pap tests and no abnormal Pap or HPV tests in the past 10 years.  Fecal occult blood test (FOBT) of stool. / Every year beginning at age 15 years and continuing until age 17 years. You may not need to do this test if you get a colonoscopy every 10 years.  Flexible sigmoidoscopy or colonoscopy.** / Every 5 years for a flexible sigmoidoscopy or every 10 years for a colonoscopy beginning at age 86 years and continuing until age 71 years.  Hepatitis C blood test.** / For all people born from [birth year] through 1965 and any individual with known risks for hepatitis C.  Osteoporosis screening.** / A one-time screening for women ages 83 years and over and women at risk for fractures or osteoporosis.  Skin self-exam. / Monthly.  Influenza vaccine. / Every year.  Tetanus, diphtheria, and acellular pertussis (Tdap/Td) vaccine.** / 1 dose of Td every 10 years.  Varicella vaccine.** / Consult your health care provider.  Zoster vaccine.** / 1 dose for adults aged 61 years or older.  Pneumococcal 13-valent conjugate (PCV13) vaccine.** / Consult your health care provider.  Pneumococcal  polysaccharide (PPSV23) vaccine.** / 1 dose for all adults aged 28 years and older.  Meningococcal vaccine.** / Consult your health care provider.  Hepatitis A vaccine.** / Consult your health care provider.  Hepatitis B vaccine.** / Consult your health care provider.  Haemophilus influenzae type b (Hib) vaccine.** / Consult your health care provider. ** Family history and personal history of risk and conditions may change your health care provider's recommendations. Document Released: 10/26/2001 Document Revised: 01/14/2014 Document Reviewed: 01/25/2011 [facility] Patient Information 2015 [place], [place]. This information is not intended to replace advice given to you by your health care provider. Make sure you discuss any questions you have with your health care provider.

## 2014-09-04 ENCOUNTER — Other Ambulatory Visit (INDEPENDENT_AMBULATORY_CARE_PROVIDER_SITE_OTHER): Payer: 59

## 2014-09-04 DIAGNOSIS — E559 Vitamin D deficiency, unspecified: Secondary | ICD-10-CM

## 2014-09-04 DIAGNOSIS — D508 Other iron deficiency anemias: Secondary | ICD-10-CM

## 2014-09-04 DIAGNOSIS — Z0000 Encounter for general adult medical examination without abnormal findings: Secondary | ICD-10-CM

## 2014-09-04 LAB — CBC WITH DIFFERENTIAL/PLATELET
Basophils Absolute: 0 10*3/uL (ref 0.0–0.1)
Basophils Relative: 0.7 % (ref 0.0–3.0)
Eosinophils Absolute: 0.1 10*3/uL (ref 0.0–0.7)
Eosinophils Relative: 1.5 % (ref 0.0–5.0)
HCT: 40.3 % (ref 36.0–46.0)
Hemoglobin: 13.3 g/dL (ref 12.0–15.0)
Lymphocytes Relative: 26 % (ref 12.0–46.0)
Lymphs Abs: 1.8 10*3/uL (ref 0.7–4.0)
MCHC: 33 g/dL (ref 30.0–36.0)
MCV: 83.5 fl (ref 78.0–100.0)
MONO ABS: 0.4 10*3/uL (ref 0.1–1.0)
Monocytes Relative: 6.1 % (ref 3.0–12.0)
NEUTROS PCT: 65.7 % (ref 43.0–77.0)
Neutro Abs: 4.6 10*3/uL (ref 1.4–7.7)
PLATELETS: 201 10*3/uL (ref 150.0–400.0)
RBC: 4.82 Mil/uL (ref 3.87–5.11)
RDW: 15.1 % (ref 11.5–15.5)
WBC: 6.9 10*3/uL (ref 4.0–10.5)

## 2014-09-04 LAB — IBC PANEL
Iron: 53 ug/dL (ref 42–145)
SATURATION RATIOS: 16.4 % — AB (ref 20.0–50.0)
Transferrin: 230.2 mg/dL (ref 212.0–360.0)

## 2014-09-04 LAB — LIPID PANEL
Cholesterol: 172 mg/dL (ref 0–200)
HDL: 31 mg/dL — ABNORMAL LOW (ref >39.00)
LDL Cholesterol: 124 mg/dL — ABNORMAL HIGH (ref 0–99)
NONHDL: 141
Total CHOL/HDL Ratio: 6
Triglycerides: 83 mg/dL (ref 0.0–149.0)
VLDL: 16.6 mg/dL (ref 0.0–40.0)

## 2014-09-04 LAB — HEPATIC FUNCTION PANEL
ALT: 17 U/L (ref 0–35)
AST: 19 U/L (ref 0–37)
Albumin: 3.6 g/dL (ref 3.5–5.2)
Alkaline Phosphatase: 72 U/L (ref 39–117)
BILIRUBIN DIRECT: 0 mg/dL (ref 0.0–0.3)
BILIRUBIN TOTAL: 0.4 mg/dL (ref 0.2–1.2)
Total Protein: 6.7 g/dL (ref 6.0–8.3)

## 2014-09-04 LAB — POCT URINALYSIS DIPSTICK
BILIRUBIN UA: NEGATIVE
Blood, UA: NEGATIVE
Glucose, UA: NEGATIVE
Ketones, UA: NEGATIVE
LEUKOCYTES UA: NEGATIVE
NITRITE UA: NEGATIVE
PH UA: 5.5
PROTEIN UA: NEGATIVE
Spec Grav, UA: >=1.03
Urobilinogen, UA: 0.2

## 2014-09-04 LAB — BASIC METABOLIC PANEL
BUN: 14 mg/dL (ref 6–23)
CO2: 25 mEq/L (ref 19–32)
CREATININE: 0.8 mg/dL (ref 0.4–1.2)
Calcium: 8.4 mg/dL (ref 8.4–10.5)
Chloride: 108 mEq/L (ref 96–112)
GFR: 86.65 mL/min (ref >60.00)
GLUCOSE: 86 mg/dL (ref 70–99)
POTASSIUM: 3.6 meq/L (ref 3.5–5.1)
Sodium: 139 mEq/L (ref 135–145)

## 2014-09-04 LAB — FERRITIN: Ferritin: 13.9 ng/mL (ref 10.0–291.0)

## 2014-09-04 LAB — VITAMIN B12: Vitamin B-12: 402 pg/mL (ref 211–911)

## 2014-09-04 LAB — TSH: TSH: 3.31 u[IU]/mL (ref 0.35–4.50)

## 2014-09-05 ENCOUNTER — Telehealth: Payer: Self-pay

## 2014-09-05 DIAGNOSIS — E785 Hyperlipidemia, unspecified: Secondary | ICD-10-CM

## 2014-09-05 NOTE — Telephone Encounter (Signed)
-----   Message from Rosalita Chessman, DO sent at 09/04/2014  1:34 PM EST ----- Cholesterol--- LDL goal < 130,  HDL >50  TG < 150.  Diet and exercise will increase HDL and decrease LDL and TG.  Fish,  Fish Oil, Flaxseed oil will also help increase the HDL and decrease Triglycerides.   Recheck labs in 3-6 months--lipid, hep--272.4 Rest of labs ok

## 2014-09-05 NOTE — Telephone Encounter (Signed)
Labs ordered.

## 2014-09-09 LAB — VITAMIN D 1,25 DIHYDROXY
VITAMIN D 1, 25 (OH) TOTAL: 56 pg/mL (ref 18–72)
VITAMIN D3 1, 25 (OH): 56 pg/mL
Vitamin D2 1, 25 (OH)2: <8 pg/mL

## 2014-12-13 ENCOUNTER — Other Ambulatory Visit: Payer: Self-pay | Admitting: Dermatology

## 2015-02-04 ENCOUNTER — Other Ambulatory Visit (INDEPENDENT_AMBULATORY_CARE_PROVIDER_SITE_OTHER): Payer: 59

## 2015-02-04 DIAGNOSIS — E785 Hyperlipidemia, unspecified: Secondary | ICD-10-CM | POA: Diagnosis not present

## 2015-02-04 LAB — HEPATIC FUNCTION PANEL
ALBUMIN: 4 g/dL (ref 3.5–5.2)
ALK PHOS: 105 U/L (ref 39–117)
ALT: 29 U/L (ref 0–35)
AST: 24 U/L (ref 0–37)
BILIRUBIN DIRECT: 0.1 mg/dL (ref 0.0–0.3)
BILIRUBIN TOTAL: 0.5 mg/dL (ref 0.2–1.2)
Total Protein: 7 g/dL (ref 6.0–8.3)

## 2015-02-04 LAB — LIPID PANEL
Cholesterol: 169 mg/dL (ref 0–200)
HDL: 38.1 mg/dL — ABNORMAL LOW (ref >39.00)
LDL CALC: 112 mg/dL — AB (ref 0–99)
NonHDL: 130.9
Total CHOL/HDL Ratio: 4
Triglycerides: 96 mg/dL (ref 0.0–149.0)
VLDL: 19.2 mg/dL (ref 0.0–40.0)

## 2015-02-28 ENCOUNTER — Ambulatory Visit: Payer: 59 | Admitting: Family Medicine

## 2015-03-03 ENCOUNTER — Ambulatory Visit (INDEPENDENT_AMBULATORY_CARE_PROVIDER_SITE_OTHER): Payer: 59 | Admitting: Family Medicine

## 2015-03-03 ENCOUNTER — Encounter: Payer: Self-pay | Admitting: Family Medicine

## 2015-03-03 VITALS — BP 112/74 | HR 72 | Temp 98.2°F | Wt 186.6 lb

## 2015-03-03 DIAGNOSIS — R42 Dizziness and giddiness: Secondary | ICD-10-CM | POA: Diagnosis not present

## 2015-03-03 DIAGNOSIS — E785 Hyperlipidemia, unspecified: Secondary | ICD-10-CM | POA: Insufficient documentation

## 2015-03-03 MED ORDER — FLUTICASONE PROPIONATE 50 MCG/ACT NA SUSP: 2.0000 | Freq: Every day | NASAL | Status: DC

## 2015-03-03 NOTE — Assessment & Plan Note (Signed)
con't claritin flonase epley maneuver HO rto prn

## 2015-03-03 NOTE — Patient Instructions (Signed)
Epley Maneuver Self-Care WHAT IS THE EPLEY MANEUVER? The Epley maneuver is an exercise you can do to relieve symptoms of benign paroxysmal positional vertigo (BPPV). This condition is often just referred to as vertigo. BPPV is caused by the movement of tiny crystals (canaliths) inside your inner ear. The accumulation and movement of canaliths in your inner ear causes a sudden spinning sensation (vertigo) when you move your head to certain positions. Vertigo usually lasts about 30 seconds. BPPV usually occurs in just one ear. If you get vertigo when you lie on your left side, you probably have BPPV in your left ear. Your health care provider can tell you which ear is involved.  BPPV may be caused by a head injury. Many people older than 50 get BPPV for unknown reasons. If you have been diagnosed with BPPV, your health care provider may teach you how to do this maneuver. BPPV is not life threatening (benign) and usually goes away in time.  WHEN SHOULD I PERFORM THE EPLEY MANEUVER? You can do this maneuver at home whenever you have symptoms of vertigo. You may do the Epley maneuver up to 3 times a day until your symptoms of vertigo go away. HOW SHOULD I DO THE EPLEY MANEUVER? 1. Sit on the edge of a bed or table with your back straight. Your legs should be extended or hanging over the edge of the bed or table.  2. Turn your head halfway toward the affected ear.  3. Lie backward quickly with your head turned until you are lying flat on your back. You may want to position a pillow under your shoulders.  4. Hold this position for 30 seconds. You may experience an attack of vertigo. This is normal. Hold this position until the vertigo stops. 5. Then turn your head to the opposite direction until your unaffected ear is facing the floor.  6. Hold this position for 30 seconds. You may experience an attack of vertigo. This is normal. Hold this position until the vertigo stops. 7. Now turn your whole body to  the same side as your head. Hold for another 30 seconds.  8. You can then sit back up. ARE THERE RISKS TO THIS MANEUVER? In some cases, you may have other symptoms (such as changes in your vision, weakness, or numbness). If you have these symptoms, stop doing the maneuver and call your health care provider. Even if doing these maneuvers relieves your vertigo, you may still have dizziness. Dizziness is the sensation of light-headedness but without the sensation of movement. Even though the Epley maneuver may relieve your vertigo, it is possible that your symptoms will return within 5 years. WHAT SHOULD I DO AFTER THIS MANEUVER? After doing the Epley maneuver, you can return to your normal activities. Ask your doctor if there is anything you should do at home to prevent vertigo. This may include:  Sleeping with two or more pillows to keep your head elevated.  Not sleeping on the side of your affected ear.  Getting up slowly from bed.  Avoiding sudden movements during the day.  Avoiding extreme head movement, like looking up or bending over.  Wearing a cervical collar to prevent sudden head movements. WHAT SHOULD I DO IF MY SYMPTOMS GET WORSE? Call your health care provider if your vertigo gets worse. Call your provider right way if you have other symptoms, including:   Nausea.  Vomiting.  Headache.  Weakness.  Numbness.  Vision changes. Document Released: 09/04/2013 Document Reviewed: 09/04/2013 [employer]   Patient Information 2015 [employer]. This information is not intended to replace advice given to you by your health care provider. Make sure you discuss any questions you have with your health care provider.  

## 2015-03-03 NOTE — Assessment & Plan Note (Signed)
Lab Results  Component Value Date   CHOL 169 02/04/2015   HDL 38.10* 02/04/2015   LDLCALC 112* 02/04/2015   TRIG 96.0 02/04/2015   CHOLHDL 4 02/04/2015   Improving--con't diet and exercise

## 2015-03-03 NOTE — Progress Notes (Signed)
Patient ID: Stacey Hickman, female    DOB: 1971-03-12  Age: 44 y.o. MRN: [id number]    Subjective:  Subjective HPI Stacey Hickman presents for f/u cholesterol.  She is also c/o dizziness over last few month but 2 bad spells over last week or so.  Episodes last about 5 minutes and then it is gone but worse recently.  She has been congested.   Using claritin on and off.    Review of Systems  Constitutional: Negative for activity change, appetite change, fatigue and unexpected weight change.  Respiratory: Negative for cough and shortness of breath.   Cardiovascular: Negative for chest pain and palpitations.  Neurological: Positive for dizziness.  Psychiatric/Behavioral: Negative for behavioral problems and dysphoric mood. The patient is not nervous/anxious.     History Past Medical History  Diagnosis Date  . Insomnia   . Dizziness   . Tendonitis of foot 11/2010    Right foot  . Left knee injury 03/2011    was in immobilizer  . Headache(784.0)   . Depression     no problems  . Gestational hypertension 05/27/2014    She has past surgical history that includes Other surgical history; Wisdom tooth extraction; and IUD removal.   Her family history includes Diabetes in her brother, brother, father, mother, and paternal grandfather; Hyperlipidemia in her brother; Hypertension in her brother; Hypothyroidism in her brother and mother; Obesity in her brother, father, mother, and sister.She reports that she has never smoked. She has never used smokeless tobacco. She reports that she drinks alcohol. She reports that she does not use illicit drugs.  Current Outpatient Prescriptions on File Prior to Visit  Medication Sig Dispense Refill  . Calcium Polycarbophil (FIBER) 625 MG TABS Take 1 tablet by mouth as needed.      Marland Kitchen ibuprofen (ADVIL,MOTRIN) 600 MG tablet Take 1 tablet (600 mg total) by mouth every 6 (six) hours. 30 tablet 0  . sertraline (ZOLOFT) 100 MG tablet 1 1/2 tab po qd 135 tablet 3  .  SUMAtriptan (IMITREX) 100 MG tablet Take 1 tablet as needed for Migraine and repeat in 2 hours if needed--Office visit due for more refills. 10 tablet 0   No current facility-administered medications on file prior to visit.     Objective:  Objective Physical Exam  Constitutional: She is oriented to person, place, and time. She appears well-developed and well-nourished. No distress.  HENT:  Right Ear: External ear normal.  Left Ear: External ear normal.  Nose: Mucosal edema and rhinorrhea present.  Mouth/Throat: Posterior oropharyngeal erythema present. No oropharyngeal exudate.  Eyes: EOM are normal. Pupils are equal, round, and reactive to light.  Neck: Normal range of motion. Neck supple.  Cardiovascular: Normal rate, regular rhythm and normal heart sounds.   No murmur heard. Pulmonary/Chest: Effort normal and breath sounds normal. No respiratory distress. She has no wheezes. She has no rales. She exhibits no tenderness.  Neurological: She is alert and oriented to person, place, and time.  Psychiatric: She has a normal mood and affect. Her behavior is normal. Judgment and thought content normal.   BP 112/74 mmHg  Pulse 72  Temp(Src) 98.2 F (36.8 C) (Oral)  Wt 186 lb 9.6 oz (84.641 kg)  SpO2 98%  Breastfeeding? Yes Wt Readings from Last 3 Encounters:  03/03/15 186 lb 9.6 oz (84.641 kg)  08/30/14 173 lb 9.6 oz (78.744 kg)  05/27/14 199 lb (90.266 kg)     Lab Results  Component Value Date  WBC 6.9 09/04/2014   HGB 13.3 09/04/2014   HCT 40.3 09/04/2014   PLT 201.0 09/04/2014   GLUCOSE 86 09/04/2014   CHOL 169 02/04/2015   TRIG 96.0 02/04/2015   HDL 38.10* 02/04/2015   LDLCALC 112* 02/04/2015   ALT 29 02/04/2015   AST 24 02/04/2015   NA 139 09/04/2014   K 3.6 09/04/2014   CL 108 09/04/2014   CREATININE 0.8 09/04/2014   BUN 14 09/04/2014   CO2 25 09/04/2014   TSH 3.31 09/04/2014    No results found.   Assessment & Plan:  Plan I have discontinued [name]'s prenatal multivitamin and NONFORMULARY OR COMPOUNDED ITEM. I am also having her start on fluticasone. Additionally, I am having her maintain her Fiber, ibuprofen, SUMAtriptan, sertraline, and levonorgestrel.  Meds ordered this encounter  Medications  . levonorgestrel (MIRENA) 20 MCG/24HR IUD    Sig: 1 each by Intrauterine route once.  . fluticasone (FLONASE) 50 MCG/ACT nasal spray    Sig: Place 2 sprays into both nostrils daily.    Dispense:  16 g    Refill:  6    Problem List Items Addressed This Visit    Hyperlipidemia - Primary    Lab Results  Component Value Date   CHOL 169 02/04/2015   HDL 38.10* 02/04/2015   LDLCALC 112* 02/04/2015   TRIG 96.0 02/04/2015   CHOLHDL 4 02/04/2015   Improving--con't diet and exercise      Dizzy    con't claritin flonase epley maneuver HO rto prn       Other Visit Diagnoses    Dizziness and giddiness        Relevant Medications    fluticasone (FLONASE) 50 MCG/ACT nasal spray       Follow-up: Return if symptoms worsen or fail to improve.  Stacey Koyanagi, DO

## 2015-03-03 NOTE — Progress Notes (Signed)
Pre visit review using our clinic review tool, if applicable. No additional management support is needed unless otherwise documented below in the visit note. 

## 2015-05-01 ENCOUNTER — Encounter: Payer: Self-pay | Admitting: Family Medicine

## 2015-05-01 MED ORDER — SUMATRIPTAN SUCCINATE 100 MG PO TABS: ORAL_TABLET | ORAL | Status: DC

## 2015-06-05 ENCOUNTER — Ambulatory Visit (HOSPITAL_BASED_OUTPATIENT_CLINIC_OR_DEPARTMENT_OTHER)
Admission: RE | Admit: 2015-06-05 | Discharge: 2015-06-05 | Disposition: A | Discharge status: Home / Self Care | Payer: 59 | Source: Ambulatory Visit | Attending: Family Medicine | Admitting: Family Medicine

## 2015-06-05 ENCOUNTER — Ambulatory Visit (INDEPENDENT_AMBULATORY_CARE_PROVIDER_SITE_OTHER): Payer: 59 | Admitting: Family Medicine

## 2015-06-05 ENCOUNTER — Encounter: Payer: Self-pay | Admitting: Family Medicine

## 2015-06-05 ENCOUNTER — Telehealth: Payer: Self-pay | Admitting: Family Medicine

## 2015-06-05 VITALS — BP 132/80 | HR 81 | Temp 98.3°F | Resp 18 | Ht 64.0 in | Wt 191.2 lb

## 2015-06-05 DIAGNOSIS — M79642 Pain in left hand: Secondary | ICD-10-CM | POA: Insufficient documentation

## 2015-06-05 DIAGNOSIS — H9193 Unspecified hearing loss, bilateral: Secondary | ICD-10-CM | POA: Diagnosis not present

## 2015-06-05 DIAGNOSIS — Z23 Encounter for immunization: Secondary | ICD-10-CM | POA: Diagnosis not present

## 2015-06-05 DIAGNOSIS — M79641 Pain in right hand: Secondary | ICD-10-CM | POA: Insufficient documentation

## 2015-06-05 DIAGNOSIS — M79643 Pain in unspecified hand: Secondary | ICD-10-CM

## 2015-06-05 MED ORDER — TOPIRAMATE 25 MG PO TABS
ORAL_TABLET | ORAL | Status: DC
Start: 2015-06-05 — End: 2015-06-05

## 2015-06-05 MED ORDER — TOPIRAMATE 25 MG PO TABS: ORAL_TABLET | ORAL | Status: DC

## 2015-06-05 MED ORDER — PREDNISONE 10 MG PO TABS: ORAL_TABLET | ORAL | Status: DC

## 2015-06-05 MED ORDER — TOPIRAMATE 50 MG PO TABS: 50.0000 mg | ORAL_TABLET | Freq: Two times a day (BID) | ORAL | Status: DC

## 2015-06-05 NOTE — Telephone Encounter (Signed)
Caller name: Aaron Edelman (pharmacist)  Relationship to patient:  Can be reached: [phone] Pharmacy:  Reason for call: pharmacy called stating that the directions on pt's script for Topiramate isn't clear. He would like to have them recent, no call back is needed.

## 2015-06-05 NOTE — Progress Notes (Signed)
Pre visit review using our clinic review tool, if applicable. No additional management support is needed unless otherwise documented below in the visit note. 

## 2015-06-05 NOTE — Telephone Encounter (Signed)
Rx resent.

## 2015-06-05 NOTE — Patient Instructions (Signed)
Topiramate tablets What is this medicine? TOPIRAMATE (toe PYRE a mate) is used to treat seizures in adults or children with epilepsy. It is also used for the prevention of migraine headaches. This medicine may be used for other purposes; ask your health care provider or pharmacist if you have questions. COMMON BRAND NAME(S): Topamax, Topiragen What should I tell my health care provider before I take this medicine? They need to know if you have any of these conditions: -bleeding disorders -cirrhosis of the liver or liver disease -diarrhea -glaucoma -kidney stones or kidney disease -low blood counts, like low white cell, platelet, or red cell counts -lung disease like asthma, obstructive pulmonary disease, emphysema -metabolic acidosis -on a ketogenic diet -schedule for surgery or a procedure -suicidal thoughts, plans, or attempt; a previous suicide attempt by you or a family member -an unusual or allergic reaction to topiramate, other medicines, foods, dyes, or preservatives -pregnant or trying to get pregnant -breast-feeding How should I use this medicine? Take this medicine by mouth with a glass of water. Follow the directions on the prescription label. Do not crush or chew. You may take this medicine with meals. Take your medicine at regular intervals. Do not take it more often than directed. Talk to your pediatrician regarding the use of this medicine in children. Special care may be needed. While this drug may be prescribed for children as young as 2 years of age for selected conditions, precautions do apply. Overdosage: If you think you have taken too much of this medicine contact a poison control center or emergency room at once. NOTE: This medicine is only for you. Do not share this medicine with others. What if I miss a dose? If you miss a dose, take it as soon as you can. If your next dose is to be taken in less than 6 hours, then do not take the missed dose. Take the next dose at  your regular time. Do not take double or extra doses. What may interact with this medicine? Do not take this medicine with any of the following medications: -probenecid This medicine may also interact with the following medications: -acetazolamide -alcohol -amitriptyline -aspirin and aspirin-like medicines -birth control pills -certain medicines for depression -certain medicines for seizures -certain medicines that treat or prevent blood clots like warfarin, enoxaparin, dalteparin, apixaban, dabigatran, and rivaroxaban -digoxin -hydrochlorothiazide -lithium -medicines for pain, sleep, or muscle relaxation -metformin -methazolamide -NSAIDS, medicines for pain and inflammation, like ibuprofen or naproxen -pioglitazone -risperidone This list may not describe all possible interactions. Give your health care provider a list of all the medicines, herbs, non-prescription drugs, or dietary supplements you use. Also tell them if you smoke, drink alcohol, or use illegal drugs. Some items may interact with your medicine. What should I watch for while using this medicine? Visit your doctor or health care professional for regular checks on your progress. Do not stop taking this medicine suddenly. This increases the risk of seizures if you are using this medicine to control epilepsy. Wear a medical identification bracelet or chain to say you have epilepsy or seizures, and carry a card that lists all your medicines. This medicine can decrease sweating and increase your body temperature. Watch for signs of deceased sweating or fever, especially in children. Avoid extreme heat, hot baths, and saunas. Be careful about exercising, especially in hot weather. Contact your health care provider right away if you notice a fever or decrease in sweating. You should drink plenty of fluids while taking this medicine.   If you have had kidney stones in the past, this will help to reduce your chances of forming kidney  stones. If you have stomach pain, with nausea or vomiting and yellowing of your eyes or skin, call your doctor immediately. You may get drowsy, dizzy, or have blurred vision. Do not drive, use machinery, or do anything that needs mental alertness until you know how this medicine affects you. To reduce dizziness, do not sit or stand up quickly, especially if you are an older patient. Alcohol can increase drowsiness and dizziness. Avoid alcoholic drinks. If you notice blurred vision, eye pain, or other eye problems, seek medical attention at once for an eye exam. The use of this medicine may increase the chance of suicidal thoughts or actions. Pay special attention to how you are responding while on this medicine. Any worsening of mood, or thoughts of suicide or dying should be reported to your health care professional right away. This medicine may increase the chance of developing metabolic acidosis. If left untreated, this can cause kidney stones, bone disease, or slowed growth in children. Symptoms include breathing fast, fatigue, loss of appetite, irregular heartbeat, or loss of consciousness. Call your doctor immediately if you experience any of these side effects. Also, tell your doctor about any surgery you plan on having while taking this medicine since this may increase your risk for metabolic acidosis. Birth control pills may not work properly while you are taking this medicine. Talk to your doctor about using an extra method of birth control. Women who become pregnant while using this medicine may enroll in the [place] Antiepileptic Drug Pregnancy Registry by calling 1-888-233-2334. This registry collects information about the safety of antiepileptic drug use during pregnancy. What side effects may I notice from receiving this medicine? Side effects that you should report to your doctor or health care professional as soon as possible: -allergic reactions like skin rash, itching or hives,  swelling of the face, lips, or tongue -decreased sweating and/or rise in body temperature -depression -difficulty breathing, fast or irregular breathing patterns -difficulty speaking -difficulty walking or controlling muscle movements -hearing impairment -redness, blistering, peeling or loosening of the skin, including inside the mouth -tingling, pain or numbness in the hands or feet -unusual bleeding or bruising -unusually weak or tired -worsening of mood, thoughts or actions of suicide or dying Side effects that usually do not require medical attention (report to your doctor or health care professional if they continue or are bothersome): -altered taste -back pain, joint or muscle aches and pains -diarrhea, or constipation -headache -loss of appetite -nausea -stomach upset, indigestion -tremors This list may not describe all possible side effects. Call your doctor for medical advice about side effects. You may report side effects to FDA at 1-800-FDA-1088. Where should I keep my medicine? Keep out of the reach of children. Store at room temperature between 15 and 30 degrees C (59 and 86 degrees F) in a tightly closed container. Protect from moisture. Throw away any unused medicine after the expiration date. NOTE: This sheet is a summary. It may not cover all possible information. If you have questions about this medicine, talk to your doctor, pharmacist, or health care provider.  2015, [employer]. (2013-09-03 23:17:57)  

## 2015-06-05 NOTE — Progress Notes (Signed)
Patient ID: Stacey Hickman, female   DOB: 06/01/1971, 44 y.o.   MRN: [id number]   Subjective:    Patient ID: Stacey Hickman, female    DOB: Dec 12, 1970, 44 y.o.   MRN: [id number]  Chief Complaint  Patient presents with  . Hand Pain    Pt with pain in right hand thumb and pinky, left hand thumb and pointer finger for 2 mos. States becoming more frequent and painful    HPI Patient is in today for R hand thumb and pinky, left hand thumb and index finger pain x 2 months.  It has been worsening over the last 2 months.    Past Medical History  Diagnosis Date  . Insomnia   . Dizziness   . Tendonitis of foot 11/2010    Right foot  . Left knee injury 03/2011    was in immobilizer  . Headache(784.0)   . Depression     no problems  . Gestational hypertension 05/27/2014    Past Surgical History  Procedure Laterality Date  . Other surgical history      laproscopic removal of IUD, perfed uterus  . Wisdom tooth extraction    . Iud removal      Family History  Problem Relation Age of Onset  . Obesity Mother   . Diabetes Mother     Prediabetic  . Hypothyroidism Mother   . Obesity Sister   . Diabetes Brother   . Obesity Brother   . Diabetes Brother   . Hypertension Brother   . Hyperlipidemia Brother   . Hypothyroidism Brother   . Diabetes Father   . Obesity Father   . Diabetes Paternal Grandfather     Social History   Social History  . Marital Status: Married    Spouse Name: N/A  . Number of Children: 3  . Years of Education: N/A   Occupational History  . PHARMACIST Stacey Hickman   Social History Main Topics  . Smoking status: Never Smoker   . Smokeless tobacco: Never Used  . Alcohol Use: Yes     Comment: occ, not with preg  . Drug Use: No  . Sexual Activity:    Partners: Male   Other Topics Concern  . Not on file   Social History Narrative   Exercising--  30 min 2x a week--- walking    Outpatient Prescriptions Prior to Visit  Medication Sig Dispense Refill  .  Calcium Polycarbophil (FIBER) 625 MG TABS Take 1 tablet by mouth as needed.      . fluticasone (FLONASE) 50 MCG/ACT nasal spray Place 2 sprays into both nostrils daily. 16 g 6  . ibuprofen (ADVIL,MOTRIN) 600 MG tablet Take 1 tablet (600 mg total) by mouth every 6 (six) hours. 30 tablet 0  . levonorgestrel (MIRENA) 20 MCG/24HR IUD 1 each by Intrauterine route once.    . sertraline (ZOLOFT) 100 MG tablet 1 1/2 tab po qd 135 tablet 3  . SUMAtriptan (IMITREX) 100 MG tablet Take 1 tablet as needed for Migraine and repeat in 2 hours if needed 10 tablet 2   No facility-administered medications prior to visit.    No Known Allergies  Review of Systems  Constitutional: Negative for fever and malaise/fatigue.  HENT: Negative for congestion.   Eyes: Negative for discharge.  Respiratory: Negative for shortness of breath.   Cardiovascular: Negative for chest pain, palpitations and leg swelling.  Gastrointestinal: Negative for nausea and abdominal pain.  Genitourinary: Negative for dysuria.  Musculoskeletal: Negative for  falls.  Skin: Negative for rash.  Neurological: Negative for loss of consciousness and headaches.  Endo/Heme/Allergies: Negative for environmental allergies.  Psychiatric/Behavioral: Negative for depression. The patient is not nervous/anxious.        Objective:    Physical Exam  Constitutional: She is oriented to person, place, and time. She appears well-developed and well-nourished.  HENT:  Head: Normocephalic and atraumatic.  Eyes: Conjunctivae and EOM are normal.  Neck: Normal range of motion. Neck supple. No JVD present. Carotid bruit is not present. No thyromegaly present.  Cardiovascular: Normal rate, regular rhythm and normal heart sounds.   No murmur heard. Pulmonary/Chest: Effort normal and breath sounds normal. No respiratory distress. She has no wheezes. She has no rales. She exhibits no tenderness.  Musculoskeletal: She exhibits edema and tenderness.        Arms: Neurological: She is alert and oriented to person, place, and time.  Psychiatric: She has a normal mood and affect. Her behavior is normal.    BP 132/80 mmHg  Pulse 81  Temp(Src) 98.3 F (36.8 C) (Oral)  Resp 18  Ht 5\' 4"  (1.626 m)  Wt 191 lb 3.2 oz (86.728 kg)  BMI 32.80 kg/m2  SpO2 98% Wt Readings from Last 3 Encounters:  06/05/15 191 lb 3.2 oz (86.728 kg)  03/03/15 186 lb 9.6 oz (84.641 kg)  08/30/14 173 lb 9.6 oz (78.744 kg)     Lab Results  Component Value Date   WBC 6.9 09/04/2014   HGB 13.3 09/04/2014   HCT 40.3 09/04/2014   PLT 201.0 09/04/2014   GLUCOSE 86 09/04/2014   CHOL 169 02/04/2015   TRIG 96.0 02/04/2015   HDL 38.10* 02/04/2015   LDLCALC 112* 02/04/2015   ALT 29 02/04/2015   AST 24 02/04/2015   NA 139 09/04/2014   K 3.6 09/04/2014   CL 108 09/04/2014   CREATININE 0.8 09/04/2014   BUN 14 09/04/2014   CO2 25 09/04/2014   TSH 3.31 09/04/2014    Lab Results  Component Value Date   TSH 3.31 09/04/2014   Lab Results  Component Value Date   WBC 6.9 09/04/2014   HGB 13.3 09/04/2014   HCT 40.3 09/04/2014   MCV 83.5 09/04/2014   PLT 201.0 09/04/2014   Lab Results  Component Value Date   NA 139 09/04/2014   K 3.6 09/04/2014   CO2 25 09/04/2014   GLUCOSE 86 09/04/2014   BUN 14 09/04/2014   CREATININE 0.8 09/04/2014   BILITOT 0.5 02/04/2015   ALKPHOS 105 02/04/2015   AST 24 02/04/2015   ALT 29 02/04/2015   PROT 7.0 02/04/2015   ALBUMIN 4.0 02/04/2015   CALCIUM 8.4 09/04/2014   ANIONGAP 14 05/27/2014   GFR 86.65 09/04/2014   Lab Results  Component Value Date   CHOL 169 02/04/2015   Lab Results  Component Value Date   HDL 38.10* 02/04/2015   Lab Results  Component Value Date   LDLCALC 112* 02/04/2015   Lab Results  Component Value Date   TRIG 96.0 02/04/2015   Lab Results  Component Value Date   CHOLHDL 4 02/04/2015   No results found for: HGBA1C     Assessment & Plan:     I am having Stacey Hickman start on  topiramate and predniSONE. I am also having her maintain her Fiber, ibuprofen, sertraline, levonorgestrel, fluticasone, and SUMAtriptan.  Meds ordered this encounter  Medications  . DISCONTD: topiramate (TOPAMAX) 25 MG tablet    Sig: QHS 1 week BID 1  week. 1 in the am and 1 at night for 2 wks    Dispense:  42 tablet    Refill:  0  . topiramate (TOPAMAX) 50 MG tablet    Sig: Take 1 tablet (50 mg total) by mouth 2 (two) times daily.    Dispense:  180 tablet    Refill:  3  . predniSONE (DELTASONE) 10 MG tablet    Sig: 3 po qd for 3 days then 2 po qd for 3 days the 1 po qd for 3 days    Dispense:  18 tablet    Refill:  0  .1. Pain of hand, unspecified laterality Check xray---  May need referral to rheum - DG Hand Complete Left; Future - DG Hand Complete Right; Future - predniSONE (DELTASONE) 10 MG tablet; 3 po qd for 3 days then 2 po qd for 3 days the 1 po qd for 3 days  Dispense: 18 tablet; Refill: 0  2. Decreased hearing, bilateral Passed test in office but pt states when there is surrounding noise she has trouble hearing - Ambulatory referral to Audiology  3. Encounter for immunization Flu shot given   Garnet Koyanagi, DO

## 2015-07-18 ENCOUNTER — Ambulatory Visit: Payer: 59 | Attending: Audiology | Admitting: Audiology

## 2015-07-18 DIAGNOSIS — H9325 Central auditory processing disorder: Secondary | ICD-10-CM | POA: Insufficient documentation

## 2015-07-18 DIAGNOSIS — H93292 Other abnormal auditory perceptions, left ear: Secondary | ICD-10-CM | POA: Diagnosis present

## 2015-07-18 NOTE — Procedures (Signed)
[facility]  [place], [place] [address]  [phone]   Audiological Evaluation  Patient Name: Stacey Hickman   Status: Outpatient   DOB: [birth year]    Diagnosis:Diminished hearing MRN: [id number] Date:  07/18/2015     Referent: Garnet Koyanagi, DO  History: Stacey Hickman was seen for an audiological evaluation.  Over the past year she reports increased difficulty with "hearing in a lot of background noise" or hearing "if the person is talking low or mumbling". Stacey Hickman states that her family is complaining that she can't hear".   She reports a significant family history of hearing loss with "two brothers and a father" having hearing loss starting in the 22-50's.  She reports a sister recently "failing a hearing screen".  Her father "has been wearing hearing aids for a long time".     Evaluation: Conventional pure tone audiometry from 250Hz  - 8000Hz  with using insert earphones and pure tones show hearing thresholds of 0-20DBHL bilaterally. Reliability is good Speech reception levels (repeating words near threshold) using recorded spondee word lists:  Right ear: 15 dBHL.  Left ear:  10 dBHL Word recognition (at comfortably loud volumes) using recorded word lists at 50 dBHL, in quiet.  Right ear: 100%.  Left ear:   100% Word recognition in minimal background noise:  +5 dBHL  Right ear: 80%                              Left ear:  68%  Tympanometry (middle ear function) with ipsilateral acoustic reflexes.  Right ear: Normal (Type A) with present acoustic reflex at 1000Hz .  Left ear: Normal (Type A) with  present acoustic reflex at 1000Hz . Distortion Product Otoacoustic Emissions (DPOAE's), a test of inner ear function was completed from 2000Hz  - 10,000Hz  bilaterally: Inner ear function was present bilaterally, but the right high frequency responses were weak - close monitoring is recommended to rule out a progressive hearing loss.   Competing  Sentences (CS) involved a different sentences being presented to each ear at different volumes. The instructions are to repeat the softer volume sentences. Posterior temporal issues will show poorer performance in the ear contralateral to the lobe involved.  Stacey Hickman scored 100% in the right ear and 60% in the left ear.  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  Dichotic Digits (DD) presents different two digits to each ear. All four digits are to be repeated. Poor performance suggests that cerebellar and/or brainstem may be involved. Stacey Hickman scored 95% in the right ear and 95% in the left ear. The test results indicate that [name] scored within normal but it is important to note that she "is very good with numbers and developed a strategy to pass this test".  Musiek's Frequency (Pitch) Pattern Test requires identification of high and low pitch tones presented each ear individually. Poor performance may occur with organization, learning issues or dylexia.  Stacey Hickman scored 100% in each ear which is within normal on this auditory processing test.  Summary of Stacey Hickman's areas of difficulty: Tolerance-Fading Memory (TFM) is associated with both difficulties understanding speech in the presence of background noise and poor short-term auditory memory.  Difficulties are usually seen in attention span, reading, comprehension and inferences, following directions, poor handwriting, auditory figure-ground, short term memory, expressive and receptive language, inconsistent articulation, oral and written discourse, and problems with distractibility.  Reduced Word Recognition in Minimal  Background Noise is the inability to hear in the presence of competing noise. This problem may be easily mistaken for inattention.  Hearing may be excellent in a quiet room but become very poor when a fan, air conditioner or heater come on, paper is rattled or music is turned on. The background noise does not have  to "sound loud" to a normal listener in order for it to be a problem for someone with an auditory processing disorder.      CONCLUSION:    Stacey Hickman has normal hearing threshold, middle and inner ear function (except for slight right ear high frequency weakness that needs monitoring). Stacey Hickman has excellent word recognition in quiet. In minimal background noise, word recognition drops to good in the right ear and is poor in the left ear.  Since poorer word recognition in background noise is  "red flag" for Central Auditory Processing Disorder (CAPD) and is consistent with her reported history a CAPD screen was completed.  Stacey Hickman scored very poorly on the left side when asked to repeat a sentence in one ear when a competing message was in the other - positive for CAPD and binaural integration.  Meaning that although Stacey Hickman may have no difficulty in quiet or with a single task, performance may deteriorate with difficult listening environments.  Central Auditory Processing Disorder (CAPD) creates a hearing difference even when hearing thresholds are within normal limits.    RECOMMENDATIONS: 1.   Monitor hearing closely with a repeat audiological evaluation in 6 months (earlier if there is any change in hearing or ear pressure) to measure 1) word recognition in background noise  2) inner ear function and 3) auditory processing screening (SSW) 2.    Strategies that help improve hearing include: A) Face the speaker directly. Optimal is having the speakers face well - lit.  Unless amplified, being within 3-6 feet of the speaker will enhance word recognition. B) Avoid having the speaker back-lit as this will minimize the ability to use cues from lip-reading, facial expression and gestures. C)  Word recognition is poorer in background noise. For optimal word recognition, turn off the TV, radio or noisy fan when engaging in conversation. In a restaurant, try to sit away from noise sources and close to the primary  speaker.  D)  Ask for topic clarification from time to time in order to remain in the conversation.  Most people don't mind repeating or clarifying a point when asked.  If needed, explain the difficulty hearing in background noise or hearing loss. 3.   Use hearing protection during noisy activities such as using a weed eater, moving the lawn, shooting, etc.    Musician's plugs, are available from [employer] for music related hearing protection because there is no distortion.  Other hearing protection, such as sponge plugs (available at pharmacies) or earmuffs (available at [employer] or [employer]) are useful for noisy activities and venues. 4.  Auditory processing self-help computer programs are now available for IPAD and computer download.  Benefit has been shown with intensive use for 10-15 minutes,  4-5 days per week. Research is suggesting that using one of the programs below for a short amount of time each day is best for the auditory processing development. Auditory Workout          IPAD only from [employer] ($24) Hear[employer].com  IPAD or PC download ($59) (Start with Phonological Awareness for decoding issues-which is the largest, most intensive program in this set.  Once Phonological Awareness is  completed continue auditory processing work with the other [employer] programs: Auditory memory, Following Directions and Sequencing using the same 10-15 minutes, 4-5 days per week)                LACE  (teenage to adult)     PC download or cd  ($99)                                                 www.neurotone.com 5.   Current research strongly indicates that learning to play a musical instrument results in improved neurological function related to       auditory processing that benefits decoding, dyslexia and hearing in background noise. Please be aware that being able to play the instrument well does not seem to matter, the  benefit comes with the learning. Please refer to the following website for  further info: www.brainvolts at [facility], Annia Friendly, PhD.  Weston Brass. Heide Spark, Au.D., CCC-A Doctor of Audiology 07/18/2015

## 2015-07-18 NOTE — Patient Instructions (Signed)
  Summary of Ziare's areas of difficulty:  Tolerance-Fading Memory (TFM) is associated with both difficulties understanding speech in the presence of background noise and poor short-term auditory memory.  Difficulties are usually seen in attention span, reading, comprehension and inferences, following directions, poor handwriting, auditory figure-ground, short term memory, expressive and receptive language, inconsistent articulation, oral and written discourse, and problems with distractibility.  Reduced Word Recognition in Minimal Background Noise is the inability to hear in the presence of competing noise. This problem may be easily mistaken for inattention.  Hearing may be excellent in a quiet room but become very poor when a fan, air conditioner or heater come on, paper is rattled or music is turned on. The background noise does not have to "sound loud" to a normal listener in order for it to be a problem for someone with an auditory processing disorder.       Auditory processing self-help computer programs are now available for IPAD and computer download.  Benefit has been shown with intensive use for 10-15 minutes,  4-5 days per week. Research is suggesting that using the programs for a short amount of time each day is better for the auditory processing development than completing the program in a short amount of time by doing it several hours per day. Auditory Workout          IPAD only from [employer] ($24) Hearbuilder.com  IPAD or PC download ($59) (Start with Phonological Awareness for decoding issues-which is the largest, most intensive program in this set.  Once Phonological Awareness is completed continue auditory processing work with the other [employer] programs: Auditory memory, Following Directions and Sequencing using the same 10-15 minutes, 4-5 days per week)                LACE  (teenage to adult)     PC download or cd  ($99)  www.neurotone.com          Other self-help measures include: 1) have conversation face to face  2) minimize background noise when having a conversation- turn off the TV, move to a quiet area of the area 3) be aware that auditory processing problems become worse with fatigue and stress  4) Avoid having important conversation with Makena's back to the speaker.    Current research strongly indicates that learning to play a musical instrument results in improved neurological function related to auditory processing that benefits decoding, dyslexia and hearing in background noise. Therefore is recommended that Terril learn to play a musical instrument for 1-2 years. Please be aware that being able to play the instrument well does not seem to matter, the benefit comes with the learning. Please refer to the following website for further info: www.brainvolts at [facility], Annia Friendly, PhD.

## 2015-09-05 ENCOUNTER — Other Ambulatory Visit: Payer: Self-pay | Admitting: Family Medicine

## 2015-10-06 ENCOUNTER — Ambulatory Visit (INDEPENDENT_AMBULATORY_CARE_PROVIDER_SITE_OTHER): Payer: 59 | Admitting: Physician Assistant

## 2015-10-06 ENCOUNTER — Encounter: Payer: Self-pay | Admitting: Physician Assistant

## 2015-10-06 ENCOUNTER — Ambulatory Visit (HOSPITAL_BASED_OUTPATIENT_CLINIC_OR_DEPARTMENT_OTHER)
Admission: RE | Admit: 2015-10-06 | Discharge: 2015-10-06 | Disposition: A | Discharge status: Home / Self Care | Payer: 59 | Source: Ambulatory Visit | Attending: Physician Assistant | Admitting: Physician Assistant

## 2015-10-06 VITALS — BP 114/68 | HR 75 | Temp 98.1°F | Resp 18 | Ht 64.0 in | Wt 191.6 lb

## 2015-10-06 DIAGNOSIS — S6991XA Unspecified injury of right wrist, hand and finger(s), initial encounter: Secondary | ICD-10-CM

## 2015-10-06 DIAGNOSIS — M25531 Pain in right wrist: Secondary | ICD-10-CM | POA: Diagnosis not present

## 2015-10-06 DIAGNOSIS — S6990XA Unspecified injury of unspecified wrist, hand and finger(s), initial encounter: Secondary | ICD-10-CM | POA: Insufficient documentation

## 2015-10-06 DIAGNOSIS — W19XXXA Unspecified fall, initial encounter: Secondary | ICD-10-CM | POA: Insufficient documentation

## 2015-10-06 MED ORDER — MELOXICAM 15 MG PO TABS: 15.0000 mg | ORAL_TABLET | Freq: Every day | ORAL | Status: DC

## 2015-10-06 MED FILL — MELOXICAM 15 MG TABLET: 15 | 30 days supply | Qty: 30 | Fill #0

## 2015-10-06 NOTE — Patient Instructions (Signed)
Please go downstairs for imaging. I will call with your results. Please take the Mobic daily as directed with food. Wear the wrist brace. Apply ice and topical Aspercreme to the area. Elevate while resting.  As long as x-ray is negative, symptoms should resolve over the next 4-5 days. Follow-up 1 week.

## 2015-10-06 NOTE — Progress Notes (Signed)
Pre visit review using our clinic review tool, if applicable. No additional management support is needed unless otherwise documented below in the visit note. 

## 2015-10-06 NOTE — Assessment & Plan Note (Signed)
Giving [place] will check x-ray to r/o fracture. Wrist brace applied. Rx Mobic. Supportive measures -- RICE -- reviewed. Follow-up and further management will be based on results

## 2015-10-06 NOTE — Progress Notes (Signed)
Patient presents to clinic today c/o pain in right wrist after a fall on outstretched hand and arm on Friday. Endorses worsening pain throughout the day with swelling. Endorses pain continues since that time, noting numbness and tingling in the wrist. Pain is shooting up to the lateral elbow. Denies bruising.   Past Medical History  Diagnosis Date  . Insomnia   . Dizziness   . Tendonitis of foot 11/2010    Right foot  . Left knee injury 03/2011    was in immobilizer  . Headache(784.0)   . Depression     no problems  . Gestational hypertension 05/27/2014    Current Outpatient Prescriptions on File Prior to Visit  Medication Sig Dispense Refill  . Calcium Polycarbophil (FIBER) 625 MG TABS Take 1 tablet by mouth as needed.      . fluticasone (FLONASE) 50 MCG/ACT nasal spray Place 2 sprays into both nostrils daily. 16 g 6  . ibuprofen (ADVIL,MOTRIN) 600 MG tablet Take 1 tablet (600 mg total) by mouth every 6 (six) hours. 30 tablet 0  . levonorgestrel (MIRENA) 20 MCG/24HR IUD 1 each by Intrauterine route once.    . sertraline (ZOLOFT) 100 MG tablet TAKE 1 AND 1/2 TABLETS BY MOUTH DAILY 135 tablet 1  . SUMAtriptan (IMITREX) 100 MG tablet Take 1 tablet as needed for Migraine and repeat in 2 hours if needed 10 tablet 2  . topiramate (TOPAMAX) 50 MG tablet Take 1 tablet (50 mg total) by mouth 2 (two) times daily. 180 tablet 3   No current facility-administered medications on file prior to visit.    No Known Allergies  Family History  Problem Relation Age of Onset  . Obesity Mother   . Diabetes Mother     Prediabetic  . Hypothyroidism Mother   . Obesity Sister   . Diabetes Brother   . Obesity Brother   . Diabetes Brother   . Hypertension Brother   . Hyperlipidemia Brother   . Hypothyroidism Brother   . Diabetes Father   . Obesity Father   . Diabetes Paternal Grandfather     Social History   Social History  . Marital Status: Married    Spouse Name: N/A  . Number of  Children: 3  . 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 Smoking status: Never Smoker   . Smokeless tobacco: Never Used  . Alcohol Use: Yes     Comment: occ, not with preg  . Drug Use: No  . Sexual Activity:    Partners: Male   Other Topics Concern  . None   Social History Narrative   Exercising--  30 min 2x a week--- walking   Review of Systems - See HPI.  All other ROS are negative.  BP 114/68 mmHg  Pulse 75  Temp(Src) 98.1 F (36.7 C) (Oral)  Resp 18  Ht 5\' 4"  (1.626 m)  Wt 191 lb 9.6 oz (86.909 kg)  BMI 32.87 kg/m2  SpO2 100%  Physical Exam  Constitutional: She is oriented to person, place, and time and well-developed, well-nourished, and in no distress.  HENT:  Head: Normocephalic and atraumatic.  Cardiovascular: Normal rate, regular rhythm, normal heart sounds and intact distal pulses.   Pulmonary/Chest: Effort normal and breath sounds normal. No respiratory distress. She has no wheezes. She has no rales. She exhibits no tenderness.  Musculoskeletal:       Right elbow: Normal.  Right wrist: She exhibits tenderness. She exhibits normal range of motion and no bony tenderness.  Neurological: She is alert and oriented to person, place, and time.  Skin: Skin is warm and dry. No rash noted.  Psychiatric: Affect normal.  Vitals reviewed.   No results found for this or any previous visit (from the past 2160 hour(s)).  Assessment/Plan: Wrist injury Giving [place] will check x-ray to r/o fracture. Wrist brace applied. Rx Mobic. Supportive measures -- RICE -- reviewed. Follow-up and further management will be based on results

## 2015-10-15 MED FILL — FLUTICASONE PROP 50 MCG SPR: 50 | 30 days supply | Qty: 16 | Fill #1

## 2015-10-22 ENCOUNTER — Encounter: Payer: Self-pay | Admitting: Family Medicine

## 2015-10-23 ENCOUNTER — Other Ambulatory Visit: Payer: Self-pay | Admitting: Family Medicine

## 2015-10-23 DIAGNOSIS — F329 Major depressive disorder, single episode, unspecified: Secondary | ICD-10-CM

## 2015-10-23 DIAGNOSIS — F32A Depression, unspecified: Secondary | ICD-10-CM

## 2015-10-23 MED ORDER — SERTRALINE HCL 100 MG PO TABS: ORAL_TABLET | ORAL | Status: DC

## 2015-11-27 MED FILL — SERTRALINE HCL 100 MG TAB: 100 | 90 days supply | Qty: 180 | Fill #0

## 2016-02-11 DIAGNOSIS — D225 Melanocytic nevi of trunk: Secondary | ICD-10-CM | POA: Diagnosis not present

## 2016-02-11 DIAGNOSIS — D2271 Melanocytic nevi of right lower limb, including hip: Secondary | ICD-10-CM | POA: Diagnosis not present

## 2016-02-11 DIAGNOSIS — D2272 Melanocytic nevi of left lower limb, including hip: Secondary | ICD-10-CM | POA: Diagnosis not present

## 2016-02-11 DIAGNOSIS — L821 Other seborrheic keratosis: Secondary | ICD-10-CM | POA: Diagnosis not present

## 2016-02-11 DIAGNOSIS — Z808 Family history of malignant neoplasm of other organs or systems: Secondary | ICD-10-CM | POA: Diagnosis not present

## 2016-02-17 MED FILL — TOPIRAMATE 50 MG TABLET: 50 | 90 days supply | Qty: 180 | Fill #1

## 2016-03-15 MED FILL — SERTRALINE HCL 100 MG TAB: 100 | 90 days supply | Qty: 180 | Fill #1

## 2016-03-30 MED FILL — SUMATRIPTAN SUCC 100 MG TAB: 100 | 30 days supply | Qty: 9 | Fill #2

## 2016-04-12 ENCOUNTER — Ambulatory Visit (INDEPENDENT_AMBULATORY_CARE_PROVIDER_SITE_OTHER): Payer: 59 | Admitting: Family Medicine

## 2016-04-12 ENCOUNTER — Ambulatory Visit (HOSPITAL_BASED_OUTPATIENT_CLINIC_OR_DEPARTMENT_OTHER)
Admission: RE | Admit: 2016-04-12 | Discharge: 2016-04-12 | Disposition: A | Discharge status: Home / Self Care | Payer: 59 | Source: Ambulatory Visit | Attending: Family Medicine | Admitting: Family Medicine

## 2016-04-12 VITALS — BP 101/70 | HR 76 | Temp 98.8°F | Resp 16 | Ht 64.0 in | Wt 187.2 lb

## 2016-04-12 DIAGNOSIS — S6991XA Unspecified injury of right wrist, hand and finger(s), initial encounter: Secondary | ICD-10-CM | POA: Diagnosis not present

## 2016-04-12 DIAGNOSIS — M79641 Pain in right hand: Secondary | ICD-10-CM

## 2016-04-12 MED ORDER — LIRAGLUTIDE -WEIGHT MANAGEMENT 18 MG/3ML ~~LOC~~ SOPN: 3.0000 mg | PEN_INJECTOR | Freq: Every day | SUBCUTANEOUS | 3 refills | Status: DC

## 2016-04-12 NOTE — Progress Notes (Signed)
Patient ID: Stacey Hickman, female    DOB: 1970-11-23  Age: 45 y.o. MRN: [id number]    Subjective:  Subjective  HPI Stacey Hickman presents for f/o R hand pain ----[place] and hit her hand 5 weeks ago and it has not gotten any better.    Review of Systems  Constitutional: Negative for activity change.  Eyes: Negative for pain, redness and visual disturbance.  Respiratory: Negative for cough, chest tightness, shortness of breath and wheezing.   Cardiovascular: Negative for chest pain, palpitations and leg swelling.  Gastrointestinal: Negative for abdominal distention and abdominal pain.  Endocrine: Negative for cold intolerance, heat intolerance, polydipsia, polyphagia and polyuria.  Musculoskeletal: Positive for joint swelling.    History Past Medical History:  Diagnosis Date  . Depression    no problems  . Dizziness   . Gestational hypertension 05/27/2014  . Headache(784.0)   . Insomnia   . Left knee injury 03/2011   was in immobilizer  . Tendonitis of foot 11/2010   Right foot    She has a past surgical history that includes Other surgical history; Wisdom tooth extraction; and IUD removal.   Her family history includes Diabetes in her brother, brother, father, mother, and paternal grandfather; Hyperlipidemia in her brother; Hypertension in her brother; Hypothyroidism in her brother and mother; Obesity in her brother, father, mother, and sister.She reports that she has never smoked. She has never used smokeless tobacco. She reports that she drinks alcohol. She reports that she does not use drugs.  Current Outpatient Prescriptions on File Prior to Visit  Medication Sig Dispense Refill  . fluticasone (FLONASE) 50 MCG/ACT nasal spray Place 2 sprays into both nostrils daily. 16 g 6  . ibuprofen (ADVIL,MOTRIN) 600 MG tablet Take 1 tablet (600 mg total) by mouth every 6 (six) hours. 30 tablet 0  . levonorgestrel (MIRENA) 20 MCG/24HR IUD 1 each by Intrauterine  route once.    . sertraline (ZOLOFT) 100 MG tablet 2 po qd 180 tablet 3  . SUMAtriptan (IMITREX) 100 MG tablet Take 1 tablet as needed for Migraine and repeat in 2 hours if needed 10 tablet 2  . topiramate (TOPAMAX) 50 MG tablet Take 1 tablet (50 mg total) by mouth 2 (two) times daily. 180 tablet 3  . Calcium Polycarbophil (FIBER) 625 MG TABS Take 1 tablet by mouth as needed.      . meloxicam (MOBIC) 15 MG tablet Take 1 tablet (15 mg total) by mouth daily. (Patient not taking: Reported on 04/12/2016) 30 tablet 0   No current facility-administered medications on file prior to visit.      Objective:  Objective  Physical Exam  Musculoskeletal:       Hands:  BP 101/70 (BP Location: Left Arm, Patient Position: Sitting, Cuff Size: Large)   Pulse 76   Temp 98.8 F (37.1 C) (Oral)   Resp 16   Ht 5\' 4"  (1.626 m)   Wt 187 lb 3.2 oz (84.9 kg)   SpO2 98%   BMI 32.13 kg/m  Wt Readings from Last 3 Encounters:  04/12/16 187 lb 3.2 oz (84.9 kg)  10/06/15 191 lb 9.6 oz (86.9 kg)  06/05/15 191 lb 3.2 oz (86.7 kg)     Lab Results  Component Value Date   WBC 6.9 09/04/2014   HGB 13.3 09/04/2014   HCT 40.3 09/04/2014   PLT 201.0 09/04/2014   GLUCOSE 86 09/04/2014   CHOL 169 02/04/2015   TRIG 96.0 02/04/2015  HDL 38.10 (L) 02/04/2015   LDLCALC 112 (H) 02/04/2015   ALT 29 02/04/2015   AST 24 02/04/2015   NA 139 09/04/2014   K 3.6 09/04/2014   CL 108 09/04/2014   CREATININE 0.8 09/04/2014   BUN 14 09/04/2014   CO2 25 09/04/2014   TSH 3.31 09/04/2014    Dg Wrist Complete Right  Result Date: 10/07/2015 CLINICAL DATA:  Pain following fall 4 days prior EXAM: RIGHT WRIST - COMPLETE 3+ VIEW COMPARISON:  June 05, 2015 FINDINGS: Frontal, oblique, lateral, and ulnar deviation scaphoid images were obtained. There is no demonstrable fracture or dislocation. Joint spaces appear intact. No erosive change. IMPRESSION: No fracture or dislocation.  No appreciable arthropathic change.  Electronically Signed   By: Lowella Grip III M.D.   On: 10/07/2015 07:46     Assessment & Plan:  Plan  I am having [name] maintain her Fiber, ibuprofen, levonorgestrel, fluticasone, SUMAtriptan, topiramate, meloxicam, and sertraline.  No orders of the defined types were placed in this encounter.   Problem List Items Addressed This Visit    None    Visit Diagnoses    Hand pain, not arthralgia, right    -  Primary    check  Xray Consider hand specialist [facility]   Follow-up: No Follow-up on file.  [name], DO

## 2016-04-12 NOTE — Patient Instructions (Addendum)
Hand Contusion  A hand contusion is a deep bruise on your hand area. Contusions are the result of an injury that caused bleeding under the skin. The contusion may turn blue, purple, or yellow. Minor injuries will give you a painless contusion, but more severe contusions may stay painful and swollen for a few weeks.  CAUSES   A contusion is usually caused by a blow, trauma, or direct force to an area of the body.  SYMPTOMS    Swelling and redness of the injured area.   Discoloration of the injured area.   Tenderness and soreness of the injured area.   Pain.  DIAGNOSIS   The diagnosis can be made by taking a history and performing a physical exam. An X-ray, CT scan, or MRI may be needed to determine if there were any associated injuries, such as broken bones (fractures).  TREATMENT   Often, the best treatment for a hand contusion is resting, elevating, icing, and applying cold compresses to the injured area. Over-the-counter medicines may also be recommended for pain control.  HOME CARE INSTRUCTIONS    Put ice on the injured area.    Put ice in a plastic bag.    Place a towel between your skin and the bag.    Leave the ice on for 15-20 minutes, 03-04 times a day.   Only take over-the-counter or prescription medicines as directed by your caregiver. Your caregiver may recommend avoiding anti-inflammatory medicines (aspirin, ibuprofen, and naproxen) for 48 hours because these medicines may increase bruising.   If told, use an elastic wrap as directed. This can help reduce swelling. You may remove the wrap for sleeping, showering, and bathing. If your fingers become numb, cold, or blue, take the wrap off and reapply it more loosely.   Elevate your hand with pillows to reduce swelling.   Avoid overusing your hand if it is painful.  SEEK IMMEDIATE MEDICAL CARE IF:    You have increased redness, swelling, or pain in your hand.   Your swelling or pain is not relieved with medicines.   You have loss of feeling in  your hand or are unable to move your fingers.   Your hand turns cold or blue.   You have pain when you move your fingers.   Your hand becomes warm to the touch.   Your contusion does not improve in 2 days.  MAKE SURE YOU:    Understand these instructions.   Will watch your condition.   Will get help right away if you are not doing well or get worse.     This information is not intended to replace advice given to you by your health care provider. Make sure you discuss any questions you have with your health care provider.     Document Released: 02/19/2002 Document Revised: 05/24/2012 Document Reviewed: 02/21/2012  Elsevier Interactive Patient Education 2016 Elsevier Inc.

## 2016-04-13 ENCOUNTER — Encounter: Payer: Self-pay | Admitting: Family Medicine

## 2016-04-14 ENCOUNTER — Telehealth: Payer: Self-pay | Admitting: *Deleted

## 2016-04-14 NOTE — Telephone Encounter (Signed)
Received PA request via fax from [employer] for [place].    PA initiated by form and faxed to [facility] department.  Confirmation received.  Awaiting response.//AB/CMA

## 2016-04-19 ENCOUNTER — Encounter: Payer: Self-pay | Admitting: Sports Medicine

## 2016-04-19 ENCOUNTER — Ambulatory Visit (INDEPENDENT_AMBULATORY_CARE_PROVIDER_SITE_OTHER): Payer: 59 | Admitting: Sports Medicine

## 2016-04-19 VITALS — BP 107/75 | Ht 63.0 in | Wt 187.0 lb

## 2016-04-19 DIAGNOSIS — M79641 Pain in right hand: Secondary | ICD-10-CM

## 2016-04-19 MED ORDER — MELOXICAM 15 MG PO TABS: ORAL_TABLET | ORAL | 0 refills | Status: DC

## 2016-04-19 MED FILL — MELOXICAM 15 MG TABLET: 15 | 40 days supply | Qty: 40 | Fill #0

## 2016-04-19 NOTE — Progress Notes (Signed)
Subjective:    Patient ID: Stacey Hickman , female   DOB: Feb 24, 1971 , 45 y.o..   MRN: [id number]  HPI  Stacey Hickman is here for injury to her right hand. She states that she went to [place] in the middle of June. She slipped while on the rock and hit her right hand, specifically hitting the radial aspect of the 2nd digit. She iced the hand and took Ibuprofen which provided a little relief. The 2nd digit was swollen but there was no notable bruising. Patient tried using an ACE bandage to immobilize the finger but this was difficult as she has 4 children and she needs her hands. She could still use her right hand but opening bottles has been difficult for her due to shooting pain in her 2nd digit. She went to her PCP 2 weeks ago and received an XRAY which was normal.    Review of Systems: Per HPI. All other systems reviewed and are negative.  Past Medical History: Patient Active Problem List   Diagnosis Date Noted  . Wrist injury 10/06/2015  . Hyperlipidemia 03/03/2015  . Dizzy 03/03/2015  . Thrombocytopenia, unspecified ([place]) 05/29/2014  . Hx of preterm delivery, currently pregnant 05/27/2014  . Gestational hypertension 05/27/2014  . Vaginal delivery 05/27/2014  . 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 Elderly multigravida with antepartum condition or complication [id number]  . Rh negative state in antepartum period 10/08/2013  . Obesity (BMI 30.0-34.9) 05/28/2013  . Migraine 05/28/2013  . DEPRESSION 08/21/2008    Medications:  Current Outpatient Prescriptions  Medication Sig Dispense Refill  . Calcium Polycarbophil (FIBER) 625 MG TABS Take 1 tablet by mouth as needed.      . fluticasone (FLONASE) 50 MCG/ACT nasal spray Place 2 sprays into both nostrils daily. 16 g 6  . ibuprofen (ADVIL,MOTRIN) 600 MG tablet Take 1 tablet (600 mg total) by mouth every 6 (six) hours. 30 tablet 0  . levonorgestrel (MIRENA) 20 MCG/24HR IUD 1 each by Intrauterine  route once.    . Liraglutide -Weight Management (SAXENDA) 18 MG/3ML SOPN Inject 3 mg into the skin daily. 5 pen 3  . meloxicam (MOBIC) 15 MG tablet Take 1 tablet (15 mg total) by mouth daily. (Patient not taking: Reported on 04/12/2016) 30 tablet 0  . meloxicam (MOBIC) 15 MG tablet Take one a day for 7 days with food then as needed 40 tablet 0  . sertraline (ZOLOFT) 100 MG tablet 2 po qd 180 tablet 3  . SUMAtriptan (IMITREX) 100 MG tablet Take 1 tablet as needed for Migraine and repeat in 2 hours if needed 10 tablet 2  . topiramate (TOPAMAX) 50 MG tablet Take 1 tablet (50 mg total) by mouth 2 (two) times daily. 180 tablet 3   No current facility-administered medications for this visit.     Social Hx:  reports that she has never smoked. She has never used smokeless tobacco.    Objective:   BP 107/75   Ht 5\' 3"  (1.6 m)   Wt 187 lb (84.8 kg)   BMI 33.13 kg/m  Physical Exam  Gen: NAD, alert, cooperative with exam, well-appearing Hand exam: No swelling or ecchymosis. Positive tenderness to palpation of MCP joint on radial aspect of 2nd digit.Pain with gripping. Ligaments intact, FROM of all joints, radial pulse normal, sensation normal. Normal contralateral hand and wrist.  X-ray: no fracture or dislocation noted.   Assessment & Plan:   ASSESSMENT: Right Hand ligament injury (  likely collateral ligament) of 2nd digit  PLAN: - Immobilize wrist for 3 weeks with a wrist brace and buddy tape digits 2 and 3 - Mobic 15 mg qd x 7 days with food  - Follow up in 3 weeks. If not improved, consider referral to hand specialist (Dr Grandville Silos).  Patient seen and evaluated with the resident. I agree with the above plan of care. We will immobilize the patient's wrist and fingers for 3 weeks, place her on meloxicam, and have her follow-up in 3 weeks. If improving, we may need to consider extending immobilization for another 3 weeks. However, if no improvement, etc. referral to hand surgery.

## 2016-04-22 NOTE — Telephone Encounter (Signed)
Resubmitted the PA through [employer].  Awaiting response.//AB/CMA

## 2016-04-27 MED FILL — SAXENDA 18 MG/3 ML PEN: 18 | 30 days supply | Qty: 15 | Fill #0

## 2016-05-10 ENCOUNTER — Ambulatory Visit (INDEPENDENT_AMBULATORY_CARE_PROVIDER_SITE_OTHER): Payer: 59 | Admitting: Sports Medicine

## 2016-05-10 VITALS — BP 109/66 | Ht 63.0 in | Wt 187.0 lb

## 2016-05-10 DIAGNOSIS — M79641 Pain in right hand: Secondary | ICD-10-CM | POA: Diagnosis not present

## 2016-05-10 NOTE — Progress Notes (Signed)
   Subjective:    Patient ID: Stacey Hickman, female    DOB: June 09, 1971, 45 y.o.   MRN: [id number]  HPI   Patient comes in today for follow-up on an injury to the radial collateral ligament to the right second MCP joint. She has been buddy taping her second and third fingers since her office visit. This is helping. She stopped wearing the cockup wrist brace because it was causing increased discomfort at the MCP joint. She is still taking meloxicam daily. Overall, she feels like she is about 40% improved.    Review of Systems    as above Objective:   Physical Exam  Well-developed, well-nourished. No acute distress. Sitting comfortably in the exam room. Vital signs reviewed.  Right hand: Examination of the right hand with attention to the second MCP joint shows full range of motion. No effusion. No soft tissue swelling. There is mild tenderness to palpation along the radial aspect of the joint and reproducible pain with stressing the radial collateral ligament. Good stability. Flexor and extensor tendons remain intact. Brisk capillary refill.      Assessment & Plan:   Improving right hand second MCP radial collateral ligament sprain  Patient will need to continue with buddy taping for at least the next 3 weeks and she will follow-up with me in 3 weeks. I explained to her that it may take a total of 5 more weeks of immobilization before complete healing. I would like for her to try to wean off of the meloxicam taking it only as needed. Call with questions or concerns in the interim.

## 2016-05-14 NOTE — Telephone Encounter (Addendum)
Spoke with the pt and she is aware of the approval, and have gotten the prescription.  Pt stated that the Rx part of the insurance will be changing and she contact us when it's time to get the prescription authorized again.  She states this want take place until mid January.//AB/CMA   Called and [facility] @ 10:07am @ [phone]) asking the pt to RTC regarding medication approval.//AB/CMA

## 2016-05-14 NOTE — Telephone Encounter (Addendum)
Received notice of Approval for the Saxenda via fax from [employer].  Saxenda 18mg /62ml pre-filled pen solution for injection, use as directed, is approved for 4 months through 08/22/2016.  Sent to be scanned.//AB/CMA

## 2016-05-31 ENCOUNTER — Ambulatory Visit (INDEPENDENT_AMBULATORY_CARE_PROVIDER_SITE_OTHER): Payer: 59 | Admitting: Sports Medicine

## 2016-05-31 ENCOUNTER — Encounter: Payer: Self-pay | Admitting: Sports Medicine

## 2016-05-31 DIAGNOSIS — M79641 Pain in right hand: Secondary | ICD-10-CM | POA: Diagnosis not present

## 2016-05-31 NOTE — Progress Notes (Signed)
   [facility] Phone: [phone]  Subjective:  Stacey Hickman is a 45 year old female presenting for follow-up of right hand pain. She has had hand pain since the middle of June, when she slipped on a rock and jammed the 2nd digit of her right hand. She was seen in Sports Medicine clinic on 04/19/16 and was thought to have an injury to her radial collateral ligament of the right second MCP joint. She advised to do buddy taping for 3 weeks and use Mobic daily. She was seen again for follow-up and her buddy taping was extended for an additional 3 weeks, because she felt that her pain had gotten a little better.  Since her last visit, she does not feel like her hand has gotten any better. She feels like it may have worsened. She has been buddy taping during the day, but not at night. The pain gets much worse when she untapes her fingers. She has been using Ibuprofen prn, which doesn't help much. She denies any numbness or tingling. She endorses mild swelling at the MCP joint.  ROS: See HPI for pertinent positives and negatives  Past Medical History- non-contributory  Social history- patient is a never smoker.  Objective: BP 115/75   Pulse 82  Gen: NAD, alert, cooperative with exam Right Hand: No erythema or edema appreciated in right hand. Mild tenderness to palpation along the radial side of the second MCP joint and second digit. Full ROM. Significant pain with abduction at the second MCP joint. Mild pain with flexion at the second MCP joint. Neurovascularly intact distally. Neuro: 5/5 grip strength bilaterally, sensation intact to light touch in hands.  Assessment/Plan: Right second MCP radial collateral ligament sprain: Not improved after 6 weeks of buddy taping. Has been going on since June. - Will refer to hand specialist- Dr. Grandville Silos at [place] using Ibuprofen as needed - Continue buddy taping until seen by hand specialist - Defer imaging to orthopedist      Hyman Bible, MD PGY-2  Patient seen and evaluated with the resident. I agree with the above plan of care.

## 2016-05-31 NOTE — Assessment & Plan Note (Signed)
Not improved after 6 weeks of buddy taping. Has been going on since June. - Will refer to hand specialist- Dr. Grandville Silos at [place] using Ibuprofen as needed - Continue buddy taping until seen by hand specialist - Defer imaging to orthopedist

## 2016-05-31 NOTE — Patient Instructions (Signed)
[place] Dr Grandville Silos Thursday 06/03/16 at 1245p Arrival time is [address] [place], [place] [address] Phone: [phone]

## 2016-06-03 DIAGNOSIS — S63650A Sprain of metacarpophalangeal joint of right index finger, initial encounter: Secondary | ICD-10-CM | POA: Diagnosis not present

## 2016-06-21 ENCOUNTER — Other Ambulatory Visit: Payer: Self-pay | Admitting: Family Medicine

## 2016-06-21 MED FILL — SERTRALINE HCL 100 MG TAB: 100 | 90 days supply | Qty: 180 | Fill #2

## 2016-06-21 MED FILL — TOPIRAMATE 50 MG TABLET: 50 | 90 days supply | Qty: 180 | Fill #0

## 2016-07-06 MED FILL — SAXENDA 18 MG/3 ML PEN: 18 | 30 days supply | Qty: 15 | Fill #1

## 2016-08-26 DIAGNOSIS — Z1231 Encounter for screening mammogram for malignant neoplasm of breast: Secondary | ICD-10-CM | POA: Diagnosis not present

## 2016-08-26 DIAGNOSIS — Z6830 Body mass index (BMI) 30.0-30.9, adult: Secondary | ICD-10-CM | POA: Diagnosis not present

## 2016-08-26 DIAGNOSIS — Z01419 Encounter for gynecological examination (general) (routine) without abnormal findings: Secondary | ICD-10-CM | POA: Diagnosis not present

## 2016-09-21 MED FILL — GENTAMICIN 3 MG/ML EYE DRP: 0.3 | 5 days supply | Qty: 5 | Fill #0

## 2016-10-02 DIAGNOSIS — H5213 Myopia, bilateral: Secondary | ICD-10-CM | POA: Diagnosis not present

## 2016-10-06 MED FILL — TOPIRAMATE 50 MG TABLET: 50 | 90 days supply | Qty: 180 | Fill #1

## 2016-10-06 MED FILL — SERTRALINE HCL 100 MG TAB: 100 | 90 days supply | Qty: 180 | Fill #3

## 2016-11-03 ENCOUNTER — Encounter: Payer: Self-pay | Admitting: Family Medicine

## 2016-11-03 ENCOUNTER — Ambulatory Visit (INDEPENDENT_AMBULATORY_CARE_PROVIDER_SITE_OTHER): Payer: 59 | Admitting: Family Medicine

## 2016-11-03 VITALS — BP 104/72 | HR 86 | Temp 98.7°F | Resp 20 | Wt 167.5 lb

## 2016-11-03 DIAGNOSIS — J40 Bronchitis, not specified as acute or chronic: Secondary | ICD-10-CM

## 2016-11-03 MED ORDER — DOXYCYCLINE HYCLATE 100 MG PO TABS: 100.0000 mg | ORAL_TABLET | Freq: Two times a day (BID) | ORAL | 0 refills | Status: DC

## 2016-11-03 MED FILL — DOXYCYCLINE HYCLATE 100 MG: 100 | 10 days supply | Qty: 20 | Fill #0

## 2016-11-03 NOTE — Patient Instructions (Signed)
Rest, hydration.  Doxy every 12 hours for 10 days.  Mucinex DM    Acute Bronchitis, Adult Acute bronchitis is when air tubes (bronchi) in the lungs suddenly get swollen. The condition can make it hard to breathe. It can also cause these symptoms:  A cough.  Coughing up clear, yellow, or green mucus.  Wheezing.  Chest congestion.  Shortness of breath.  A fever.  Body aches.  Chills.  A sore throat. Follow these instructions at home: Medicines  Take over-the-counter and prescription medicines only as told by your doctor.  If you were prescribed an antibiotic medicine, take it as told by your doctor. Do not stop taking the antibiotic even if you start to feel better. General instructions  Rest.  Drink enough fluids to keep your pee (urine) clear or pale yellow.  Avoid smoking and secondhand smoke. If you smoke and you need help quitting, ask your doctor. Quitting will help your lungs heal faster.  Use an inhaler, cool mist vaporizer, or humidifier as told by your doctor.  Keep all follow-up visits as told by your doctor. This is important. How is this prevented? To lower your risk of getting this condition again:  Wash your hands often with soap and water. If you cannot use soap and water, use hand sanitizer.  Avoid contact with people who have cold symptoms.  Try not to touch your hands to your mouth, nose, or eyes.  Make sure to get the flu shot every year. Contact a doctor if:  Your symptoms do not get better in 2 weeks. Get help right away if:  You cough up blood.  You have chest pain.  You have very bad shortness of breath.  You become dehydrated.  You faint (pass out) or keep feeling like you are going to pass out.  You keep throwing up (vomiting).  You have a very bad headache.  Your fever or chills gets worse. This information is not intended to replace advice given to you by your health care provider. Make sure you discuss any questions  you have with your health care provider. Document Released: 02/16/2008 Document Revised: 04/07/2016 Document Reviewed: 02/18/2016 Elsevier Interactive Patient Education  2017 [employer].

## 2016-11-03 NOTE — Progress Notes (Signed)
MALALA KIMERY , Jun 09, 1971, 46 y.o., female MRN: [id number] Patient Care Team    Relationship Specialty Notifications Start End  [name], [place] PCP - General Family Medicine  03/31/11    Comment: Vella Kohler Himmelrich, RD (Inactive) Dietitian   07/27/11     CC: fever Subjective: Pt presents for an acute OV with complaints of  of fever duration.  Associated symptoms include fever, chills, night sweats, fatigue, cough, and mild shortness of breath. She denies nausea, vomit or diarrhea.  Pt feels symptoms are progressing. She has tried many over the counter medications, she is a Software engineer.    No Known Allergies Social History  Substance Use Topics  . Smoking status: Never Smoker  . Smokeless tobacco: Never Used  . Alcohol use Yes     Comment: occ, not with preg   Past Medical History:  Diagnosis Date  . Depression    no problems  . Dizziness   . Gestational hypertension 05/27/2014  . Headache(784.0)   . Insomnia   . Left knee injury 03/2011   was in immobilizer  . Tendonitis of foot 11/2010   Right foot   Past Surgical History:  Procedure Laterality Date  . IUD REMOVAL    . OTHER SURGICAL HISTORY     laproscopic removal of IUD, perfed uterus  . WISDOM TOOTH EXTRACTION     Family History  Problem Relation Age of Onset  . Obesity Mother   . Diabetes Mother     Prediabetic  . Hypothyroidism Mother   . Obesity Sister   . Diabetes Brother   . Obesity Brother   . Diabetes Brother   . Hypertension Brother   . Hyperlipidemia Brother   . Hypothyroidism Brother   . Diabetes Father   . Obesity Father   . Diabetes Paternal Grandfather    Allergies as of 11/03/2016   No Known Allergies     Medication List       Accurate as of 11/03/16 11:27 AM. Always use your most recent med list.          Fiber 625 MG Tabs Take 1 tablet by mouth as needed.   fluticasone 50 MCG/ACT nasal spray Commonly known as:  FLONASE Place 2 sprays into both nostrils  daily.   ibuprofen 600 MG tablet Commonly known as:  ADVIL,MOTRIN Take 1 tablet (600 mg total) by mouth every 6 (six) hours.   levonorgestrel 20 MCG/24HR IUD Commonly known as:  MIRENA 1 each by Intrauterine route once.   Liraglutide -Weight Management 18 MG/3ML Sopn Commonly known as:  SAXENDA Inject 3 mg into the skin daily.   sertraline 100 MG tablet Commonly known as:  ZOLOFT 2 po qd   SUMAtriptan 100 MG tablet Commonly known as:  IMITREX Take 1 tablet as needed for Migraine and repeat in 2 hours if needed   topiramate 50 MG tablet Commonly known as:  TOPAMAX TAKE 1 TABLET BY MOUTH TWICE DAILY       No results found for this or any previous visit (from the past 24 hour(s)). No results found.   ROS: Negative, with the exception of above mentioned in HPI   Objective:  BP 104/72 (BP Location: Right Arm, Patient Position: Sitting, Cuff Size: Large)   Pulse 86   Temp 98.7 F (37.1 C)   Resp 20   Wt 167 lb 8 oz (76 kg)   SpO2 97%   BMI 29.67 kg/m  Body mass index  is 29.67 kg/m. Gen: Afebrile. No acute distress. Nontoxic in appearance, well developed, well nourished.  HENT: AT. [place]. Bilateral TM visualized fullness. MMM, no oral lesions. Bilateral nares with erythema, drianage. Throat without erythema or exudates. Mild cough, hoarseness.  Eyes:Pupils Equal Round Reactive to light, Extraocular movements intact,  Conjunctiva without redness, discharge or icterus. Neck/lymp/endocrine: Supple,no lymphadenopathy CV: RRR  Chest: mild crackles RLL, otherwise clear and good air movement. .  Abd: Soft. NTND. BS present.  Neuro: Normal gait. PERLA. EOMi. Alert. Oriented x3   Assessment/Plan: JYLLIAN CREEDON is a 46 y.o. female present for acute OV for  Bronchitis - rest, hydrate. mucinex DM. - Doxy BID for 10 days.  - Mild concern for PNA given exam, pt opted to watch and wait for xray. 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  - F/U if no  improved or worsening within 1 -2 weeks.     electronically signed by:  Howard Pouch, DO  [place]

## 2016-11-17 ENCOUNTER — Ambulatory Visit (HOSPITAL_BASED_OUTPATIENT_CLINIC_OR_DEPARTMENT_OTHER)
Admission: RE | Admit: 2016-11-17 | Discharge: 2016-11-17 | Disposition: A | Discharge status: Home / Self Care | Payer: 59 | Source: Ambulatory Visit | Attending: Medical | Admitting: Medical

## 2016-11-17 ENCOUNTER — Telehealth: Payer: Self-pay | Admitting: Medical

## 2016-11-17 ENCOUNTER — Ambulatory Visit (INDEPENDENT_AMBULATORY_CARE_PROVIDER_SITE_OTHER): Payer: 59 | Admitting: Medical

## 2016-11-17 VITALS — BP 107/69 | HR 72 | Temp 98.1°F | Ht 63.0 in | Wt 172.6 lb

## 2016-11-17 DIAGNOSIS — J40 Bronchitis, not specified as acute or chronic: Secondary | ICD-10-CM | POA: Diagnosis not present

## 2016-11-17 DIAGNOSIS — R05 Cough: Secondary | ICD-10-CM

## 2016-11-17 DIAGNOSIS — R059 Cough, unspecified: Secondary | ICD-10-CM

## 2016-11-17 DIAGNOSIS — R5383 Other fatigue: Secondary | ICD-10-CM | POA: Insufficient documentation

## 2016-11-17 DIAGNOSIS — R0789 Other chest pain: Secondary | ICD-10-CM | POA: Diagnosis not present

## 2016-11-17 MED ORDER — AZITHROMYCIN 250 MG PO TABS: ORAL_TABLET | ORAL | 0 refills | Status: DC

## 2016-11-17 MED FILL — AZITHROMYCIN 250 MG TABLET: 250 | 5 days supply | Qty: 6 | Fill #0

## 2016-11-17 NOTE — Progress Notes (Signed)
Pre visit review using our clinic review tool, if applicable. No additional management support is needed unless otherwise documented below in the visit note. 

## 2016-11-17 NOTE — Patient Instructions (Addendum)
You appear to have bronchitis. Rest hydrate and tylenol for fever. For cough use your cheratussin, and  rx azithromycin  antibiotic.   Will get cbc and cxr evaluate if any walking pneumonia  Your chest pain seems more consistent with costochondritis. Recommend low dose ibuprofen to see if this resolves discomfort. If worse or changing features more cardiac like then ED evaluation.  Follow up in 7-10 days or as needed

## 2016-11-17 NOTE — Telephone Encounter (Signed)
Opened to review 

## 2016-11-17 NOTE — Progress Notes (Signed)
Subjective:    Patient ID: Stacey Hickman, female    DOB: 12-Apr-1971, 46 y.o.   MRN: [id number]  HPI  2 weeks ago pt had some bronchitis  was post flu like presentation.   Pt feels like she has pressure in chest even at rest but very low leve. When she takes deep breath will have chest pain hat is sharp and transient in intesntiy. Pt had some wheezing about 2 weeks ago. Pt states when prior provider listened she thought she heard rough breath sounds. Friends who are nurses thought they heard this as well.  Pt not bringing up mucous when coughs but cough persists some. Pt when seen end of February did not get tested for flu since presented 4-5 days after classic onset. Pt was given doxycycline which she finished 4 days ago.    Review of Systems  Constitutional: Positive for fatigue. Negative for chills.  Respiratory: Positive for cough. Negative for chest tightness, shortness of breath and wheezing.   Cardiovascular: Negative for chest pain and palpitations.       Costochondral junction area pain.  Gastrointestinal: Negative for abdominal pain.  Musculoskeletal: Negative for back pain.  Skin: Negative for rash.  Neurological: Negative for dizziness, syncope, speech difficulty and headaches.  Hematological: Negative for adenopathy. Does not bruise/bleed easily.  Psychiatric/Behavioral: Negative for behavioral problems and confusion.     Past Medical History:  Diagnosis Date  . Depression    no problems  . Dizziness   . Gestational hypertension 05/27/2014  . Headache(784.0)   . Insomnia   . Left knee injury 03/2011   was in immobilizer  . Tendonitis of foot 11/2010   Right foot     Social History   Social History  . Marital status: Married    Spouse name: N/A  . Number of children: 3  . Years of education: N/A   Occupational History  . PHARMACIST [employer]   Social History Main Topics  . Smoking status: Never Smoker  . Smokeless tobacco: Never Used  . Alcohol use  Yes     Comment: occ, not with preg  . Drug use: No  . Sexual activity: Yes    Partners: Male   Other Topics Concern  . Not on file   Social History Narrative   Exercising--  30 min 2x a week--- walking    Past Surgical History:  Procedure Laterality Date  . IUD REMOVAL    . OTHER SURGICAL HISTORY     laproscopic removal of IUD, perfed uterus  . WISDOM TOOTH EXTRACTION      Family History  Problem Relation Age of Onset  . Obesity Mother   . Diabetes Mother     Prediabetic  . Hypothyroidism Mother   . Obesity Sister   . Diabetes Brother   . Obesity Brother   . Diabetes Brother   . Hypertension Brother   . Hyperlipidemia Brother   . Hypothyroidism Brother   . Diabetes Father   . Obesity Father   . Diabetes Paternal Grandfather     No Known Allergies  Current Outpatient Prescriptions on File Prior to Visit  Medication Sig Dispense Refill  . Calcium Polycarbophil (FIBER) 625 MG TABS Take 1 tablet by mouth as needed.      . fluticasone (FLONASE) 50 MCG/ACT nasal spray Place 2 sprays into both nostrils daily. 16 g 6  . ibuprofen (ADVIL,MOTRIN) 600 MG tablet Take 1 tablet (600 mg total) by mouth every 6 (six) hours.  30 tablet 0  . levonorgestrel (MIRENA) 20 MCG/24HR IUD 1 each by Intrauterine route once.    . Liraglutide -Weight Management (SAXENDA) 18 MG/3ML SOPN Inject 3 mg into the skin daily. 5 pen 3  . sertraline (ZOLOFT) 100 MG tablet 2 po qd 180 tablet 3  . SUMAtriptan (IMITREX) 100 MG tablet Take 1 tablet as needed for Migraine and repeat in 2 hours if needed 10 tablet 2  . topiramate (TOPAMAX) 50 MG tablet TAKE 1 TABLET BY MOUTH TWICE DAILY 180 tablet 3   No current facility-administered medications on file prior to visit.     BP 107/69 (BP Location: Left Arm, Cuff Size: Normal)   Pulse 72   Temp 98.1 F (36.7 C) (Oral)   Ht 5\' 3"  (1.6 m)   Wt 172 lb 9.6 oz (78.3 kg)   SpO2 100%   BMI 30.57 kg/m       Objective:   Physical  Exam  General  Mental Status - Alert. General Appearance - Well groomed. Not in acute distress.  Skin Rashes- No Rashes.  HEENT Head- Normal. Ear Auditory Canal - Left- Normal. Right - Normal.Tympanic Membrane- Left- Normal. Right- Normal. Eye Sclera/Conjunctiva- Left- Normal. Right- Normal. Nose & Sinuses Nasal Mucosa- Left-  Boggy and Congested. Right-  Boggy and  Congested.Bilateral no  maxillary and  No frontal sinus pressure. Mouth & Throat Lips: Upper Lip- Normal: no dryness, cracking, pallor, cyanosis, or vesicular eruption. Lower Lip-Normal: no dryness, cracking, pallor, cyanosis or vesicular eruption. Buccal Mucosa- Bilateral- No Aphthous ulcers. Oropharynx- No Discharge or Erythema. Tonsils: Characteristics- Bilateral- No Erythema or Congestion. Size/Enlargement- Bilateral- No enlargement. Discharge- bilateral-None.  Neck Neck- Supple. No Masses.   Chest and Lung Exam Auscultation: Breath Sounds:-Clear even and unlabored.  Cardiovascular Auscultation:Rythm- Regular, rate and rhythm. Murmurs & Other Heart Sounds:Ausculatation of the heart reveal- No Murmurs.  Lymphatic Head & Neck General Head & Neck Lymphatics: Bilateral: Description- No Localized lymphadenopathy.  Lower ext- no pedal edema. Symmetric calfs.  Anterior thorax- faint mild tenderness to palpation costochandral junction.       Assessment & Plan:  You appear to have bronchitis. Rest hydrate and tylenol for fever. For  use your cheratussin, and  rx azithromycin  antibiotic.   Will get cbc and cxr evaluate if any walking pneumonia  Your chest pain seems more consistent with costochondritis. Recommend low dose ibuprofen to see if this resolves discomfort. If worse or changing features more cardiac like then ED evaluation.  ekg appears nsr and compared to one in 2014. Appears not changes.  Follow up in 7-10 days or as needed

## 2016-11-18 LAB — CBC WITH DIFFERENTIAL/PLATELET
Basophils Absolute: 0.1 10*3/uL (ref 0.0–0.1)
Basophils Relative: 1.1 % (ref 0.0–3.0)
EOS ABS: 0.2 10*3/uL (ref 0.0–0.7)
Eosinophils Relative: 2.1 % (ref 0.0–5.0)
HEMATOCRIT: 45.8 % (ref 36.0–46.0)
HEMOGLOBIN: 15.3 g/dL — AB (ref 12.0–15.0)
LYMPHS PCT: 24.5 % (ref 12.0–46.0)
Lymphs Abs: 2.5 10*3/uL (ref 0.7–4.0)
MCHC: 33.4 g/dL (ref 30.0–36.0)
MCV: 89.4 fl (ref 78.0–100.0)
MONO ABS: 0.6 10*3/uL (ref 0.1–1.0)
Monocytes Relative: 5.5 % (ref 3.0–12.0)
Neutro Abs: 6.9 10*3/uL (ref 1.4–7.7)
Neutrophils Relative %: 66.8 % (ref 43.0–77.0)
Platelets: 221 10*3/uL (ref 150.0–400.0)
RBC: 5.13 Mil/uL — AB (ref 3.87–5.11)
RDW: 14 % (ref 11.5–15.5)
WBC: 10.3 10*3/uL (ref 4.0–10.5)

## 2017-01-26 ENCOUNTER — Other Ambulatory Visit: Payer: Self-pay | Admitting: Family Medicine

## 2017-01-26 DIAGNOSIS — F329 Major depressive disorder, single episode, unspecified: Secondary | ICD-10-CM

## 2017-01-26 DIAGNOSIS — F32A Depression, unspecified: Secondary | ICD-10-CM

## 2017-01-26 MED FILL — TOPIRAMATE 50 MG TABLET: 50 | 90 days supply | Qty: 180 | Fill #2

## 2017-01-27 MED FILL — SERTRALINE HCL 100 MG TAB: 100 | 90 days supply | Qty: 180 | Fill #0

## 2017-01-27 MED FILL — SUMATRIPTAN SUCC 100 MG TAB: 100 | 30 days supply | Qty: 10 | Fill #0

## 2017-02-16 DIAGNOSIS — L720 Epidermal cyst: Secondary | ICD-10-CM | POA: Diagnosis not present

## 2017-02-16 DIAGNOSIS — D2272 Melanocytic nevi of left lower limb, including hip: Secondary | ICD-10-CM | POA: Diagnosis not present

## 2017-02-16 DIAGNOSIS — D2271 Melanocytic nevi of right lower limb, including hip: Secondary | ICD-10-CM | POA: Diagnosis not present

## 2017-02-16 DIAGNOSIS — Z808 Family history of malignant neoplasm of other organs or systems: Secondary | ICD-10-CM | POA: Diagnosis not present

## 2017-02-16 DIAGNOSIS — D225 Melanocytic nevi of trunk: Secondary | ICD-10-CM | POA: Diagnosis not present

## 2017-02-16 DIAGNOSIS — Z86018 Personal history of other benign neoplasm: Secondary | ICD-10-CM | POA: Diagnosis not present

## 2017-02-16 DIAGNOSIS — D2261 Melanocytic nevi of right upper limb, including shoulder: Secondary | ICD-10-CM | POA: Diagnosis not present

## 2017-05-13 MED FILL — TOPIRAMATE 50 MG TABLET: 50 | 90 days supply | Qty: 180 | Fill #3

## 2017-05-13 MED FILL — SERTRALINE HCL 100 MG TAB: 100 | 90 days supply | Qty: 180 | Fill #1

## 2017-07-18 MED FILL — SUMATRIPTAN SUCC 100 MG TAB: 100 | 30 days supply | Qty: 8 | Fill #1

## 2017-08-24 ENCOUNTER — Other Ambulatory Visit: Payer: Self-pay | Admitting: Family Medicine

## 2017-08-24 MED FILL — TOPIRAMATE 50 MG TABLET: 50 | 90 days supply | Qty: 180 | Fill #0

## 2017-08-24 MED FILL — SERTRALINE HCL 100 MG TAB: 100 | 90 days supply | Qty: 180 | Fill #2

## 2017-11-29 MED FILL — SERTRALINE HCL 100 MG TAB: 100 | 90 days supply | Qty: 180 | Fill #3

## 2017-11-29 MED FILL — TOPIRAMATE 50 MG TABLET: 50 | 90 days supply | Qty: 180 | Fill #1

## 2017-12-01 ENCOUNTER — Encounter: Payer: Self-pay | Admitting: Family Medicine

## 2017-12-01 DIAGNOSIS — Z111 Encounter for screening for respiratory tuberculosis: Secondary | ICD-10-CM

## 2017-12-05 ENCOUNTER — Other Ambulatory Visit (INDEPENDENT_AMBULATORY_CARE_PROVIDER_SITE_OTHER): Payer: BLUE CROSS/BLUE SHIELD

## 2017-12-05 DIAGNOSIS — Z111 Encounter for screening for respiratory tuberculosis: Secondary | ICD-10-CM | POA: Diagnosis not present

## 2017-12-06 LAB — QUANTIFERON-TB GOLD PLUS
NIL: 0.02 [IU]/mL
QUANTIFERON-TB GOLD PLUS: NEGATIVE
TB1-NIL: <0 IU/mL

## 2017-12-12 DIAGNOSIS — Z01419 Encounter for gynecological examination (general) (routine) without abnormal findings: Secondary | ICD-10-CM | POA: Diagnosis not present

## 2017-12-12 DIAGNOSIS — Z1231 Encounter for screening mammogram for malignant neoplasm of breast: Secondary | ICD-10-CM | POA: Diagnosis not present

## 2017-12-12 DIAGNOSIS — Z6832 Body mass index (BMI) 32.0-32.9, adult: Secondary | ICD-10-CM | POA: Diagnosis not present

## 2017-12-12 DIAGNOSIS — Z124 Encounter for screening for malignant neoplasm of cervix: Secondary | ICD-10-CM | POA: Diagnosis not present

## 2017-12-12 DIAGNOSIS — Z3040 Encounter for surveillance of contraceptives, unspecified: Secondary | ICD-10-CM | POA: Diagnosis not present

## 2017-12-13 DIAGNOSIS — M25532 Pain in left wrist: Secondary | ICD-10-CM | POA: Diagnosis not present

## 2018-01-04 MED FILL — METHYLPREDNISOLONE 4 MG TAB: 4 | 6 days supply | Qty: 21 | Fill #0

## 2018-01-30 ENCOUNTER — Encounter: Payer: Self-pay | Admitting: Family Medicine

## 2018-01-30 ENCOUNTER — Ambulatory Visit (INDEPENDENT_AMBULATORY_CARE_PROVIDER_SITE_OTHER): Payer: BLUE CROSS/BLUE SHIELD | Admitting: Family Medicine

## 2018-01-30 VITALS — BP 100/78 | HR 85 | Temp 98.3°F | Resp 16 | Ht 63.0 in | Wt 191.2 lb

## 2018-01-30 DIAGNOSIS — G43809 Other migraine, not intractable, without status migrainosus: Secondary | ICD-10-CM

## 2018-01-30 DIAGNOSIS — F321 Major depressive disorder, single episode, moderate: Secondary | ICD-10-CM | POA: Diagnosis not present

## 2018-01-30 DIAGNOSIS — Z0000 Encounter for general adult medical examination without abnormal findings: Secondary | ICD-10-CM | POA: Diagnosis not present

## 2018-01-30 MED ORDER — BUPROPION HCL ER (XL) 150 MG PO TB24: ORAL_TABLET | ORAL | 0 refills | Status: DC

## 2018-01-30 MED ORDER — SUMATRIPTAN SUCCINATE 100 MG PO TABS
ORAL_TABLET | ORAL | 2 refills | Status: DC
Start: 2018-01-30 — End: 2019-04-18

## 2018-01-30 MED FILL — buPROPion HCL ER (XL) 150 M: 150 | 30 days supply | Qty: 60 | Fill #0

## 2018-01-30 MED FILL — SUMATRIPTAN SUCC 100 MG TAB: 100 | 17 days supply | Qty: 10 | Fill #0

## 2018-01-30 NOTE — Patient Instructions (Signed)
Preventive Care 40-64 Years, Female Preventive care refers to lifestyle choices and visits with your health care provider that can promote health and wellness. What does preventive care include?  A yearly physical exam. This is also called an annual well check.  Dental exams once or twice a year.  Routine eye exams. Ask your health care provider how often you should have your eyes checked.  Personal lifestyle choices, including: ? Daily care of your teeth and gums. ? Regular physical activity. ? Eating a healthy diet. ? Avoiding tobacco and drug use. ? Limiting alcohol use. ? Practicing safe sex. ? Taking low-dose aspirin daily starting at age 58. ? Taking vitamin and mineral supplements as recommended by your health care provider. What happens during an annual well check? The services and screenings done by your health care provider during your annual well check will depend on your age, overall health, lifestyle risk factors, and family history of disease. Counseling Your health care provider may ask you questions about your:  Alcohol use.  Tobacco use.  Drug use.  Emotional well-being.  Home and relationship well-being.  Sexual activity.  Eating habits.  Work and work Statistician.  Method of birth control.  Menstrual cycle.  Pregnancy history.  Screening You may have the following tests or measurements:  Height, weight, and BMI.  Blood pressure.  Lipid and cholesterol levels. These may be checked every 5 years, or more frequently if you are over 81 years old.  Skin check.  Lung cancer screening. You may have this screening every year starting at age 78 if you have a 30-pack-year history of smoking and currently smoke or have quit within the past 15 years.  Fecal occult blood test (FOBT) of the stool. You may have this test every year starting at age 65.  Flexible sigmoidoscopy or colonoscopy. You may have a sigmoidoscopy every 5 years or a colonoscopy  every 10 years starting at age 30.  Hepatitis C blood test.  Hepatitis B blood test.  Sexually transmitted disease (STD) testing.  Diabetes screening. This is done by checking your blood sugar (glucose) after you have not eaten for a while (fasting). You may have this done every 1-3 years.  Mammogram. This may be done every 1-2 years. Talk to your health care provider about when you should start having regular mammograms. This may depend on whether you have a family history of breast cancer.  BRCA-related cancer screening. This may be done if you have a family history of breast, ovarian, tubal, or peritoneal cancers.  Pelvic exam and Pap test. This may be done every 3 years starting at age 80. Starting at age 36, this may be done every 5 years if you have a Pap test in combination with an HPV test.  Bone density scan. This is done to screen for osteoporosis. You may have this scan if you are at high risk for osteoporosis.  Discuss your test results, treatment options, and if necessary, the need for more tests with your health care provider. Vaccines Your health care provider may recommend certain vaccines, such as:  Influenza vaccine. This is recommended every year.  Tetanus, diphtheria, and acellular pertussis (Tdap, Td) vaccine. You may need a Td booster every 10 years.  Varicella vaccine. You may need this if you have not been vaccinated.  Zoster vaccine. You may need this after age 5.  Measles, mumps, and rubella (MMR) vaccine. You may need at least one dose of MMR if you were born in  1957 or later. You may also need a second dose.  Pneumococcal 13-valent conjugate (PCV13) vaccine. You may need this if you have certain conditions and were not previously vaccinated.  Pneumococcal polysaccharide (PPSV23) vaccine. You may need one or two doses if you smoke cigarettes or if you have certain conditions.  Meningococcal vaccine. You may need this if you have certain  conditions.  Hepatitis A vaccine. You may need this if you have certain conditions or if you travel or work in places where you may be exposed to hepatitis A.  Hepatitis B vaccine. You may need this if you have certain conditions or if you travel or work in places where you may be exposed to hepatitis B.  Haemophilus influenzae type b (Hib) vaccine. You may need this if you have certain conditions.  Talk to your health care provider about which screenings and vaccines you need and how often you need them. This information is not intended to replace advice given to you by your health care provider. Make sure you discuss any questions you have with your health care provider. Document Released: 09/26/2015 Document Revised: 05/19/2016 Document Reviewed: 07/01/2015 Elsevier Interactive Patient Education  2018 Elsevier Inc.  

## 2018-01-30 NOTE — Progress Notes (Signed)
Subjective:     Stacey Hickman is a 47 y.o. female and is here for a comprehensive physical exam. The patient reports problems -  increase stress and depression secondary to work / home/ kids.  Pt is not suicidal No other complaints    Social History   Socioeconomic History  . Marital status: Married    Spouse name: Not on file  . Number of children: 3  . Years of education: Not on file  . Highest education level: Not on file  Occupational History  . Occupation: Marine scientist: [employer]  . Financial resource strain: Not on file  . Food insecurity:    Worry: Not on file    Inability: Not on file  . Transportation needs:    Medical: Not on file    Non-medical: Not on file  Tobacco Use  . Smoking status: Never Smoker  . Smokeless tobacco: Never Used  Substance and Sexual Activity  . Alcohol use: Yes    Comment: occ, not with preg  . Drug use: No  . Sexual activity: Yes    Partners: Male  Lifestyle  . Physical activity:    Days per week: Not on file    Minutes per session: Not on file  . Stress: Not on file  Relationships  . Social connections:    Talks on phone: Not on file    Gets together: Not on file    Attends religious service: Not on file    Active member of club or organization: Not on file    Attends meetings of clubs or organizations: Not on file    Relationship status: Not on file  . Intimate partner violence:    Fear of current or ex partner: Not on file    Emotionally abused: Not on file    Physically abused: Not on file    Forced sexual activity: Not on file  Other Topics Concern  . Not on file  Social History Narrative   Exercising--  30 min 2x a week--- walking   Health Maintenance  Topic Date Due  . PAP SMEAR  07/10/2017  . INFLUENZA VACCINE  04/13/2018  . TETANUS/TDAP  05/28/2024  . HIV Screening  Completed    The following portions of the patient's history were reviewed and updated as appropriate:  She  has a past  medical history of Depression, Dizziness, Gestational hypertension (05/27/2014), Headache(784.0), Insomnia, Left knee injury (03/2011), and Tendonitis of foot (11/2010). She does not have any pertinent problems on file. She  has a past surgical history that includes Other surgical history; Wisdom tooth extraction; and IUD removal. Her family history includes Diabetes in her brother, brother, father, mother, and paternal grandfather; Hyperlipidemia in her brother; Hypertension in her brother; Hypothyroidism in her brother and mother; Obesity in her brother, father, mother, and sister. She  reports that she has never smoked. She has never used smokeless tobacco. She reports that she drinks alcohol. She reports that she does not use drugs. She has a current medication list which includes the following prescription(s): fluticasone, ibuprofen, levonorgestrel, sertraline, sumatriptan, topiramate, and bupropion. Current Outpatient Medications on File Prior to Visit  Medication Sig Dispense Refill  . fluticasone (FLONASE) 50 MCG/ACT nasal spray Place 2 sprays into both nostrils daily. 16 g 6  . ibuprofen (ADVIL,MOTRIN) 600 MG tablet Take 1 tablet (600 mg total) by mouth every 6 (six) hours. 30 tablet 0  . levonorgestrel (MIRENA) 20 MCG/24HR IUD 1  each by Intrauterine route once.    . sertraline (ZOLOFT) 100 MG tablet TAKE TWO TABLETS BY MOUTH DAILY 180 tablet 3  . topiramate (TOPAMAX) 50 MG tablet TAKE 1 TABLET BY MOUTH TWICE A DAY 180 tablet 3   No current facility-administered medications on file prior to visit.    She has No Known Allergies..  Review of Systems Review of Systems  Constitutional: Negative for activity change, appetite change and fatigue.  HENT: Negative for hearing loss, congestion, tinnitus and ear discharge.  dentist q35m Eyes: Negative for visual disturbance (see optho q1y -- vision corrected to 20/20 with glasses).  Respiratory: Negative for cough, chest tightness and shortness of  breath.   Cardiovascular: Negative for chest pain, palpitations and leg swelling.  Gastrointestinal: Negative for abdominal pain, diarrhea, constipation and abdominal distention.  Genitourinary: Negative for urgency, frequency, decreased urine volume and difficulty urinating.  Musculoskeletal: Negative for back pain, arthralgias and gait problem.  Skin: Negative for color change, pallor and rash.  Neurological: Negative for dizziness, light-headedness, numbness and headaches.  Hematological: Negative for adenopathy. Does not bruise/bleed easily.  Psychiatric/Behavioral: Negative for suicidal ideas, confusion, sleep disturbance, self-injury, dysphoric mood, decreased concentration and agitation.       Objective:    BP 100/78 (BP Location: Left Arm, Cuff Size: Large)   Pulse 85   Temp 98.3 F (36.8 C) (Oral)   Resp 16   Ht 5\' 3"  (1.6 m)   Wt 191 lb 3.2 oz (86.7 kg)   SpO2 98%   BMI 33.87 kg/m  General appearance: alert, cooperative, appears stated age and no distress Head: Normocephalic, without obvious abnormality, atraumatic Eyes: negative findings: lids and lashes normal, conjunctivae and sclerae normal and pupils equal, round, reactive to light and accomodation Ears: normal TM's and external ear canals both ears Nose: Nares normal. Septum midline. Mucosa normal. No drainage or sinus tenderness. Throat: lips, mucosa, and tongue normal; teeth and gums normal Neck: no adenopathy, no carotid bruit, no JVD, supple, symmetrical, trachea midline and thyroid not enlarged, symmetric, no tenderness/mass/nodules Back: symmetric, no curvature. ROM normal. No CVA tenderness. Lungs: clear to auscultation bilaterally Breasts: gyn Heart: regular rate and rhythm, S1, S2 normal, no murmur, click, rub or gallop Abdomen: soft, non-tender; bowel sounds normal; no masses,  no organomegaly Pelvic: deferred --gyn Extremities: extremities normal, atraumatic, no cyanosis or edema Pulses: 2+ and  symmetric Skin: Skin color, texture, turgor normal. No rashes or lesions Lymph nodes: Cervical, supraclavicular, and axillary nodes normal. Neurologic: Alert and oriented X 3, normal strength and tone. Normal symmetric reflexes. Normal coordination and gait    Assessment:    Healthy female exam.      Plan:     ghm utd Check labs See After Visit Summary for Counseling Recommendations    1. Preventative health care See above - CBC with Differential/Platelet; Future - Comprehensive metabolic panel; Future - Lipid panel; Future - TSH; Future  2. Other migraine without status migrainosus, not intractable Has app with neuro coming up - SUMAtriptan (IMITREX) 100 MG tablet; TAKE 1 TABLET BY MOUTH AS NEEDED FOR MIGRAINE AND REPEAT IN 2 HOURS IF NEEDED  Dispense: 10 tablet; Refill: 2  3. Depression, major, single episode, moderate ([place]) Add wellbutrin to zoloft - buPROPion (WELLBUTRIN XL) 150 MG 24 hr tablet; 1 po qd x 1 week then 2 po qd  Dispense: 60 tablet; Refill: 0  4. Morbid obesity ([place])  - Amb Ref to Medical Weight Management

## 2018-01-30 NOTE — Assessment & Plan Note (Signed)
Has f/u with neuro coming up

## 2018-02-01 ENCOUNTER — Other Ambulatory Visit (INDEPENDENT_AMBULATORY_CARE_PROVIDER_SITE_OTHER): Payer: BLUE CROSS/BLUE SHIELD

## 2018-02-01 DIAGNOSIS — Z0000 Encounter for general adult medical examination without abnormal findings: Secondary | ICD-10-CM

## 2018-02-01 LAB — COMPREHENSIVE METABOLIC PANEL
ALK PHOS: 41 U/L (ref 39–117)
ALT: 12 U/L (ref 0–35)
AST: 12 U/L (ref 0–37)
Albumin: 3.8 g/dL (ref 3.5–5.2)
BILIRUBIN TOTAL: 0.5 mg/dL (ref 0.2–1.2)
BUN: 14 mg/dL (ref 6–23)
CO2: 24 mEq/L (ref 19–32)
CREATININE: 0.89 mg/dL (ref 0.40–1.20)
Calcium: 8.6 mg/dL (ref 8.4–10.5)
Chloride: 113 mEq/L — ABNORMAL HIGH (ref 96–112)
GFR: 72.21 mL/min (ref >60.00)
GLUCOSE: 97 mg/dL (ref 70–99)
Potassium: 3.8 mEq/L (ref 3.5–5.1)
Sodium: 143 mEq/L (ref 135–145)
TOTAL PROTEIN: 6.4 g/dL (ref 6.0–8.3)

## 2018-02-01 LAB — CBC WITH DIFFERENTIAL/PLATELET
BASOS ABS: 0 10*3/uL (ref 0.0–0.1)
Basophils Relative: 0.7 % (ref 0.0–3.0)
EOS ABS: 0.1 10*3/uL (ref 0.0–0.7)
Eosinophils Relative: 1.3 % (ref 0.0–5.0)
HEMATOCRIT: 42.2 % (ref 36.0–46.0)
Hemoglobin: 14.2 g/dL (ref 12.0–15.0)
LYMPHS ABS: 1.5 10*3/uL (ref 0.7–4.0)
Lymphocytes Relative: 23.6 % (ref 12.0–46.0)
MCHC: 33.6 g/dL (ref 30.0–36.0)
MCV: 90 fl (ref 78.0–100.0)
MONOS PCT: 5.3 % (ref 3.0–12.0)
Monocytes Absolute: 0.3 10*3/uL (ref 0.1–1.0)
NEUTROS PCT: 69.1 % (ref 43.0–77.0)
Neutro Abs: 4.4 10*3/uL (ref 1.4–7.7)
PLATELETS: 156 10*3/uL (ref 150.0–400.0)
RBC: 4.68 Mil/uL (ref 3.87–5.11)
RDW: 12.8 % (ref 11.5–15.5)
WBC: 6.3 10*3/uL (ref 4.0–10.5)

## 2018-02-01 LAB — LIPID PANEL
Cholesterol: 146 mg/dL (ref 0–200)
HDL: 29.8 mg/dL — AB (ref >39.00)
LDL Cholesterol: 81 mg/dL (ref 0–99)
NonHDL: 116.56
TRIGLYCERIDES: 176 mg/dL — AB (ref 0.0–149.0)
Total CHOL/HDL Ratio: 5
VLDL: 35.2 mg/dL (ref 0.0–40.0)

## 2018-02-01 LAB — TSH: TSH: 4.62 u[IU]/mL — ABNORMAL HIGH (ref 0.35–4.50)

## 2018-02-08 ENCOUNTER — Other Ambulatory Visit (INDEPENDENT_AMBULATORY_CARE_PROVIDER_SITE_OTHER): Payer: BLUE CROSS/BLUE SHIELD

## 2018-02-08 ENCOUNTER — Other Ambulatory Visit: Payer: Self-pay | Admitting: *Deleted

## 2018-02-08 DIAGNOSIS — E039 Hypothyroidism, unspecified: Secondary | ICD-10-CM

## 2018-02-08 LAB — T3, FREE: T3, Free: 2.9 pg/mL (ref 2.3–4.2)

## 2018-02-08 LAB — T4, FREE: Free T4: 0.62 ng/dL (ref 0.60–1.60)

## 2018-02-08 LAB — TSH: TSH: 3.62 u[IU]/mL (ref 0.35–4.50)

## 2018-03-02 ENCOUNTER — Encounter: Payer: Self-pay | Admitting: Family Medicine

## 2018-03-02 ENCOUNTER — Telehealth: Payer: Self-pay | Admitting: Family Medicine

## 2018-03-02 ENCOUNTER — Ambulatory Visit (INDEPENDENT_AMBULATORY_CARE_PROVIDER_SITE_OTHER): Payer: BLUE CROSS/BLUE SHIELD | Admitting: Family Medicine

## 2018-03-02 VITALS — BP 118/76 | HR 69 | Temp 97.9°F | Resp 18 | Wt 187.4 lb

## 2018-03-02 DIAGNOSIS — F321 Major depressive disorder, single episode, moderate: Secondary | ICD-10-CM

## 2018-03-02 DIAGNOSIS — G43809 Other migraine, not intractable, without status migrainosus: Secondary | ICD-10-CM | POA: Diagnosis not present

## 2018-03-02 MED ORDER — BUPROPION HCL ER (XL) 150 MG PO TB24: ORAL_TABLET | ORAL | 3 refills | Status: DC

## 2018-03-02 MED ORDER — TOPIRAMATE 50 MG PO TABS: 50.0000 mg | ORAL_TABLET | Freq: Two times a day (BID) | ORAL | 3 refills | Status: DC

## 2018-03-02 NOTE — Progress Notes (Signed)
Subjective:  I acted as a Education administrator for [employer]. Yancey Flemings, [place]   Patient ID: Stacey Hickman, female    DOB: November 29, 1970, 47 y.o.   MRN: [id number]  Chief Complaint  Patient presents with  . Depression    HPI  Patient is in today for follow up on depression.  She is doing really well.   Patient Care Team: Carollee Herter, Alferd Apa, DO as PCP - General (Family Medicine) Himmelrich, Bryson Ha, RD (Inactive) as Dietitian   Past Medical History:  Diagnosis Date  . Depression    no problems  . Dizziness   . Gestational hypertension 05/27/2014  . Headache(784.0)   . Insomnia   . Left knee injury 03/2011   was in immobilizer  . Tendonitis of foot 11/2010   Right foot    Past Surgical History:  Procedure Laterality Date  . IUD REMOVAL    . OTHER SURGICAL HISTORY     laproscopic removal of IUD, perfed uterus  . WISDOM TOOTH EXTRACTION      Family History  Problem Relation Age of Onset  . Obesity Mother   . Diabetes Mother        Prediabetic  . Hypothyroidism Mother   . Obesity Sister   . Diabetes Brother   . Obesity Brother   . Diabetes Brother   . Hypertension Brother   . Hyperlipidemia Brother   . Hypothyroidism Brother   . Diabetes Father   . Obesity Father   . Diabetes Paternal Grandfather     Social History   Socioeconomic History  . Marital status: Married    Spouse name: Not on file  . Number of children: 3  . Years of education: Not on file  . Highest education level: Not on file  Occupational History  . Occupation: Marine scientist: [employer]  . Financial resource strain: Not on file  . Food insecurity:    Worry: Not on file    Inability: Not on file  . Transportation needs:    Medical: Not on file    Non-medical: Not on file  Tobacco Use  . Smoking status: Never Smoker  . Smokeless tobacco: Never Used  Substance and Sexual Activity  . Alcohol use: Yes    Comment: occ, not with preg  . Drug use: No  . Sexual activity:  Yes    Partners: Male  Lifestyle  . Physical activity:    Days per week: Not on file    Minutes per session: Not on file  . Stress: Not on file  Relationships  . Social connections:    Talks on phone: Not on file    Gets together: Not on file    Attends religious service: Not on file    Active member of club or organization: Not on file    Attends meetings of clubs or organizations: Not on file    Relationship status: Not on file  . Intimate partner violence:    Fear of current or ex partner: Not on file    Emotionally abused: Not on file    Physically abused: Not on file    Forced sexual activity: Not on file  Other Topics Concern  . Not on file  Social History Narrative   Exercising--  30 min 2x a week--- walking    Outpatient Medications Prior to Visit  Medication Sig Dispense Refill  . fluticasone (FLONASE) 50 MCG/ACT nasal spray Place 2 sprays into both nostrils daily.  16 g 6  . ibuprofen (ADVIL,MOTRIN) 600 MG tablet Take 1 tablet (600 mg total) by mouth every 6 (six) hours. 30 tablet 0  . levonorgestrel (MIRENA) 20 MCG/24HR IUD 1 each by Intrauterine route once.    . sertraline (ZOLOFT) 100 MG tablet TAKE TWO TABLETS BY MOUTH DAILY 180 tablet 3  . SUMAtriptan (IMITREX) 100 MG tablet TAKE 1 TABLET BY MOUTH AS NEEDED FOR MIGRAINE AND REPEAT IN 2 HOURS IF NEEDED 10 tablet 2  . buPROPion (WELLBUTRIN XL) 150 MG 24 hr tablet 1 po qd x 1 week then 2 po qd 60 tablet 0  . topiramate (TOPAMAX) 50 MG tablet TAKE 1 TABLET BY MOUTH TWICE A DAY 180 tablet 3   No facility-administered medications prior to visit.     No Known Allergies  Review of Systems  Constitutional: Negative for chills, fever and malaise/fatigue.  HENT: Negative for congestion and hearing loss.   Eyes: Negative for discharge.  Respiratory: Negative for cough, sputum production and shortness of breath.   Cardiovascular: Negative for chest pain, palpitations and leg swelling.  Gastrointestinal: Negative for  abdominal pain, blood in stool, constipation, diarrhea, heartburn, nausea and vomiting.  Genitourinary: Negative for dysuria, frequency, hematuria and urgency.  Musculoskeletal: Negative for back pain, falls and myalgias.  Skin: Negative for rash.  Neurological: Negative for dizziness, sensory change, loss of consciousness, weakness and headaches.  Endo/Heme/Allergies: Negative for environmental allergies. Does not bruise/bleed easily.  Psychiatric/Behavioral: Positive for depression. Negative for suicidal ideas. The patient is not nervous/anxious and does not have insomnia.        Objective:    Physical Exam  Constitutional: She is oriented to person, place, and time. She appears well-developed and well-nourished.  HENT:  Head: Normocephalic and atraumatic.  Eyes: Conjunctivae and EOM are normal.  Neck: Normal range of motion. Neck supple. No JVD present. Carotid bruit is not present. No thyromegaly present.  Cardiovascular: Normal rate, regular rhythm and normal heart sounds.  No murmur heard. Pulmonary/Chest: Effort normal and breath sounds normal. No respiratory distress. She has no wheezes. She has no rales. She exhibits no tenderness.  Musculoskeletal: She exhibits no edema.  Neurological: She is alert and oriented to person, place, and time.  Psychiatric: She has a normal mood and affect.    BP 118/76 (BP Location: Left Arm, Patient Position: Sitting, Cuff Size: Normal)   Pulse 69   Temp 97.9 F (36.6 C) (Oral)   Resp 18   Wt 187 lb 6.4 oz (85 kg)   SpO2 97%   BMI 33.20 kg/m  Wt Readings from Last 3 Encounters:  03/02/18 187 lb 6.4 oz (85 kg)  01/30/18 191 lb 3.2 oz (86.7 kg)  11/17/16 172 lb 9.6 oz (78.3 kg)   BP Readings from Last 3 Encounters:  03/02/18 118/76  01/30/18 100/78  11/17/16 107/69     Immunization History  Administered Date(s) Administered  . Influenza Whole 06/30/2007, 07/19/2012  . Influenza,inj,Quad PF,6+ Mos 06/19/2013, 06/05/2015  .  Influenza-Unspecified 06/18/2014  . Tdap 05/28/2014    Health Maintenance  Topic Date Due  . INFLUENZA VACCINE  04/13/2018  . PAP SMEAR  01/13/2021  . TETANUS/TDAP  05/28/2024  . HIV Screening  Completed    Lab Results  Component Value Date   WBC 6.3 02/01/2018   HGB 14.2 02/01/2018   HCT 42.2 02/01/2018   PLT 156.0 02/01/2018   GLUCOSE 97 02/01/2018   CHOL 146 02/01/2018   TRIG 176.0 (H) 02/01/2018   HDL  29.80 (L) 02/01/2018   LDLCALC 81 02/01/2018   ALT 12 02/01/2018   AST 12 02/01/2018   NA 143 02/01/2018   K 3.8 02/01/2018   CL 113 (H) 02/01/2018   CREATININE 0.89 02/01/2018   BUN 14 02/01/2018   CO2 24 02/01/2018   TSH 3.62 02/08/2018    Lab Results  Component Value Date   TSH 3.62 02/08/2018   Lab Results  Component Value Date   WBC 6.3 02/01/2018   HGB 14.2 02/01/2018   HCT 42.2 02/01/2018   MCV 90.0 02/01/2018   PLT 156.0 02/01/2018   Lab Results  Component Value Date   NA 143 02/01/2018   K 3.8 02/01/2018   CO2 24 02/01/2018   GLUCOSE 97 02/01/2018   BUN 14 02/01/2018   CREATININE 0.89 02/01/2018   BILITOT 0.5 02/01/2018   ALKPHOS 41 02/01/2018   AST 12 02/01/2018   ALT 12 02/01/2018   PROT 6.4 02/01/2018   ALBUMIN 3.8 02/01/2018   CALCIUM 8.6 02/01/2018   ANIONGAP 14 05/27/2014   GFR 72.21 02/01/2018   Lab Results  Component Value Date   CHOL 146 02/01/2018   Lab Results  Component Value Date   HDL 29.80 (L) 02/01/2018   Lab Results  Component Value Date   LDLCALC 81 02/01/2018   Lab Results  Component Value Date   TRIG 176.0 (H) 02/01/2018   Lab Results  Component Value Date   CHOLHDL 5 02/01/2018   No results found for: HGBA1C       Assessment & Plan:   Problem List Items Addressed This Visit      Unprioritized   Migraine - Primary   Relevant Medications   buPROPion (WELLBUTRIN XL) 150 MG 24 hr tablet   topiramate (TOPAMAX) 50 MG tablet    Other Visit Diagnoses    Depression, major, single episode, moderate  (HCC)       Relevant Medications   buPROPion (WELLBUTRIN XL) 150 MG 24 hr tablet    stable for both con't meds   I have changed Stacey Hickman's buPROPion and topiramate. I am also having her maintain her ibuprofen, levonorgestrel, fluticasone, sertraline, and SUMAtriptan.  Meds ordered this encounter  Medications  . buPROPion (WELLBUTRIN XL) 150 MG 24 hr tablet    Sig: 3 po qd    Dispense:  270 tablet    Refill:  3  . topiramate (TOPAMAX) 50 MG tablet    Sig: Take 1 tablet (50 mg total) by mouth 2 (two) times daily. 2 po bid    Dispense:  360 tablet    Refill:  3    CMA served as scribe during this visit. History, Physical and Plan performed by medical provider. Documentation and orders reviewed and attested to.  [name], DO

## 2018-03-02 NOTE — Telephone Encounter (Signed)
Copied from [place] [id number]. Topic: Quick Communication - Rx Refill/Question >> Mar 02, 2018  4:22 PM Waylan Rocher, Lumin L wrote: Medication: topiramate (TOPAMAX) 50 MG tablet   Has the patient contacted their pharmacy? Yes.   (Agent: If no, request that the patient contact the pharmacy for the refill.) (Agent: If yes, when and what did the pharmacy advise?)  Preferred Pharmacy (with phone number or street name): [facility], [place]. [address] [place] [address] Phone: [phone] Fax: [phone]  Agent: Please be advised that RX refills may take up to 3 business days. We ask that you follow-up with your pharmacy.

## 2018-03-02 NOTE — Telephone Encounter (Signed)
Refills sent during visit today

## 2018-03-02 NOTE — Patient Instructions (Signed)
Depression Screening Depression screening is a tool that your health care provider can use to learn if you have symptoms of depression. Depression is a common condition with many symptoms that are also often found in other conditions. Depression is treatable, but it must first be diagnosed. You may not know that certain feelings, thoughts, and behaviors that you are having can be symptoms of depression. Taking a depression screening test can help you and your health care provider decide if you need more assessment, or if you should be referred to a mental health care provider. What are the screening tests?  You may have a physical exam to see if another condition is affecting your mental health. You may have a blood or urine sample taken during the physical exam.  You may be interviewed using a screening tool that was developed from research, such as one of these: ? Patient Health Questionnaire (PHQ). This is a set of either 2 or 9 questions. A health care provider who has been trained to score this screening test uses a guide to assess if your symptoms suggest that you may have depression. ? Hamilton Depression Rating Scale (HAM-D). This is a set of either 17 or 24 questions. You may be asked to take it again during or after your treatment, to see if your depression has gotten better. ? Beck Depression Inventory (BDI). This is a set of 21 multiple choice questions. Your health care provider scores your answers to assess:  Your level of depression, ranging from mild to severe.  Your response to treatment.  Your health care provider may talk with you about your daily activities, such as eating, sleeping, work, and recreation, and ask if you have had any changes in activity.  Your health care provider may ask you to see a mental health specialist, such as a psychiatrist or psychologist, for more evaluation. Who should be screened for depression?  All adults, including adults with a family history  of a mental health disorder.  Adolescents who are 12-18 years old.  People who are recovering from a myocardial infarction (MI).  Pregnant women, or women who have given birth.  People who have a long-term (chronic) illness.  Anyone who has been diagnosed with another type of a mental health disorder.  Anyone who has symptoms that could show depression. What do my results mean? Your health care provider will review the results of your depression screening, physical exam, and lab tests. Positive screens suggest that you may have depression. Screening is the first step in getting the care that you may need. It is up to you to get your screening results. Ask your health care provider, or the department that is doing your screening tests, when your results will be ready. Talk with your health care provider about your results and diagnosis. A diagnosis of depression is made using the Diagnostic and Statistical Manual of Mental Disorders (DSM-V). This is a book that lists the number and type of symptoms that must be present for a health care provider to give a specific diagnosis.  Your health care provider may work with you to treat your symptoms of depression, or your health care provider may help you find a mental health provider who can assess, diagnose, and treat your depression. Get help right away if:  You have thoughts about hurting yourself or others. If you ever feel like you may hurt yourself or others, or have thoughts about taking your own life, get help right away. You can   go to your nearest emergency department or call:  Your local emergency services (911 in the [place]  A suicide crisis helpline, such as the National Suicide Prevention Lifeline at 1-800-273-8255. This is open 24 hours a day.  Summary  Depression screening is the first step in getting the help that you may need.  If your screening test shows symptoms of depression (is positive), your health care provider may ask  you to see a mental health provider.  Anyone who is age 12 or older should be screened for depression. This information is not intended to replace advice given to you by your health care provider. Make sure you discuss any questions you have with your health care provider. Document Released: 01/14/2017 Document Revised: 01/14/2017 Document Reviewed: 01/14/2017 Elsevier Interactive Patient Education  2018 Elsevier Inc.  

## 2018-03-03 ENCOUNTER — Other Ambulatory Visit: Payer: Self-pay | Admitting: Family Medicine

## 2018-03-03 ENCOUNTER — Encounter: Payer: Self-pay | Admitting: Family Medicine

## 2018-03-03 DIAGNOSIS — F329 Major depressive disorder, single episode, unspecified: Secondary | ICD-10-CM

## 2018-03-03 DIAGNOSIS — G43809 Other migraine, not intractable, without status migrainosus: Secondary | ICD-10-CM

## 2018-03-03 DIAGNOSIS — F32A Depression, unspecified: Secondary | ICD-10-CM

## 2018-03-03 MED FILL — TOPIRAMATE 50 MG TABLET: 50 | 90 days supply | Qty: 180 | Fill #2

## 2018-03-03 MED FILL — BUPROPION HCL XL 150 MG TAB: 150 | 90 days supply | Qty: 270 | Fill #0

## 2018-03-06 MED ORDER — TOPIRAMATE 50 MG PO TABS: 100.0000 mg | ORAL_TABLET | Freq: Two times a day (BID) | ORAL | 3 refills | Status: DC

## 2018-03-14 MED FILL — TOPIRAMATE 100 MG TABLET: 100 | 90 days supply | Qty: 180 | Fill #0

## 2018-03-14 MED FILL — SERTRALINE HCL 100 MG TAB: 100 | 90 days supply | Qty: 180 | Fill #0

## 2018-06-08 MED FILL — SERTRALINE HCL 100 MG TAB: 100 | 90 days supply | Qty: 180 | Fill #1

## 2018-06-08 MED FILL — SUMAtriptan SUCCINATE 100 M: 100 | 17 days supply | Qty: 10 | Fill #1

## 2018-06-16 DIAGNOSIS — N898 Other specified noninflammatory disorders of vagina: Secondary | ICD-10-CM | POA: Diagnosis not present

## 2018-06-16 DIAGNOSIS — N9489 Other specified conditions associated with female genital organs and menstrual cycle: Secondary | ICD-10-CM | POA: Diagnosis not present

## 2018-12-06 ENCOUNTER — Encounter: Payer: Self-pay | Admitting: Family Medicine

## 2018-12-07 ENCOUNTER — Encounter: Payer: Self-pay | Admitting: Family Medicine

## 2018-12-07 ENCOUNTER — Other Ambulatory Visit: Payer: Self-pay

## 2018-12-07 ENCOUNTER — Ambulatory Visit (INDEPENDENT_AMBULATORY_CARE_PROVIDER_SITE_OTHER): Payer: 59 | Admitting: Family Medicine

## 2018-12-07 ENCOUNTER — Encounter: Payer: Self-pay | Admitting: *Deleted

## 2018-12-07 DIAGNOSIS — F418 Other specified anxiety disorders: Secondary | ICD-10-CM | POA: Diagnosis not present

## 2018-12-07 MED ORDER — BUPROPION HCL ER (XL) 300 MG PO TB24: 300.0000 mg | ORAL_TABLET | Freq: Every day | ORAL | 1 refills | Status: DC

## 2018-12-07 MED ORDER — DESVENLAFAXINE SUCCINATE ER 50 MG PO TB24: 50.0000 mg | ORAL_TABLET | Freq: Every day | ORAL | 3 refills | Status: DC

## 2018-12-07 MED ORDER — ALPRAZOLAM 0.25 MG PO TABS: 0.2500 mg | ORAL_TABLET | Freq: Two times a day (BID) | ORAL | 0 refills | Status: DC | PRN

## 2018-12-07 NOTE — Progress Notes (Signed)
Virtual Visit via Video Note  I connected with Stacey Hickman on 12/07/18 at 10:30 AM EDT by a video enabled telemedicine application and verified that I am speaking with the correct person using two identifiers.   I discussed the limitations of evaluation and management by telemedicine and the availability of in person appointments. The patient expressed understanding and agreed to proceed.  History of Present Illness: Pt is having worsening symptoms of anxiety and depression .  Stress has increased with covid virus and she was doing a video visit yesterday with a pt and her pt went to get something in another room and she found her daughter dead.  She has been very upset about this and crying a lot more.   She is not suicidal   pt is sitting in her office and I am in mine Observations/Objective: Wt 164 bp low -- normal for pt  Normal resp  Pt crying during first part of visit but is able to calm down during rest of the interview   Assessment and Plan: Depression/ anxiety-- wean off zoloft , cut down on wellbutrin  Start pristiq 50 mg daily and f/u 1 month or sooner prn  Pt also given xanax for prn use   Follow Up Instructions:    I discussed the assessment and treatment plan with the patient. The patient was provided an opportunity to ask questions and all were answered. The patient agreed with the plan and demonstrated an understanding of the instructions.   The patient was advised to call back or seek an in-person evaluation if the symptoms worsen or if the condition fails to improve as anticipated.  I provided 30 minutes of non-face-to-face time during this encounter.   [name], DO

## 2018-12-07 NOTE — Telephone Encounter (Signed)
lmom to call back 

## 2018-12-29 ENCOUNTER — Other Ambulatory Visit: Payer: Self-pay | Admitting: Family Medicine

## 2018-12-29 DIAGNOSIS — F418 Other specified anxiety disorders: Secondary | ICD-10-CM

## 2019-01-02 ENCOUNTER — Encounter: Payer: Self-pay | Admitting: Family Medicine

## 2019-01-04 ENCOUNTER — Other Ambulatory Visit: Payer: Self-pay

## 2019-01-04 ENCOUNTER — Ambulatory Visit (INDEPENDENT_AMBULATORY_CARE_PROVIDER_SITE_OTHER): Payer: 59 | Admitting: Family Medicine

## 2019-01-04 ENCOUNTER — Encounter: Payer: Self-pay | Admitting: Family Medicine

## 2019-01-04 DIAGNOSIS — F418 Other specified anxiety disorders: Secondary | ICD-10-CM

## 2019-01-04 MED ORDER — DESVENLAFAXINE SUCCINATE ER 100 MG PO TB24: 100.0000 mg | ORAL_TABLET | Freq: Every day | ORAL | 3 refills | Status: DC

## 2019-01-04 NOTE — Progress Notes (Signed)
Virtual Visit via Video Note  I connected with Stacey Hickman on 01/04/19 at  9:00 AM EDT by a video enabled telemedicine application and verified that I am speaking with the correct person using two identifiers.   I discussed the limitations of evaluation and management by telemedicine and the availability of in person appointments. The patient expressed understanding and agreed to proceed.  History of Present Illness: Pt is home ---- pristiq is working 'ok" but not as good as she hoped   She is not suicidal    She used the xanax a few times but only takes 1/2 tab No other complaints.     Observations/Objective: No vitals due to virtual visit  Pt is in NAD,  Pt comfortable Normal speech  Normal affect   Assessment and Plan: 1. Depression with anxiety Inc pristiq to 100 mg   F./u 1 month or sooner prn  - desvenlafaxine (PRISTIQ) 100 MG 24 hr tablet; Take 1 tablet (100 mg total) by mouth daily.  Dispense: 30 tablet; Refill: 3 With >50% time discussing meds, side effects and f/u  Follow Up Instructions:    I discussed the assessment and treatment plan with the patient. The patient was provided an opportunity to ask questions and all were answered. The patient agreed with the plan and demonstrated an understanding of the instructions.   The patient was advised to call back or seek an in-person evaluation if the symptoms worsen or if the condition fails to improve as anticipated.  I provided 30 minutes of non-face-to-face time during this encounter.   [name], DO

## 2019-01-05 ENCOUNTER — Telehealth: Payer: Self-pay | Admitting: Family Medicine

## 2019-01-05 ENCOUNTER — Telehealth: Payer: Self-pay

## 2019-01-05 NOTE — Telephone Encounter (Signed)
LMOVM for pt to return call to our office to set up 4 wk f/u virtual OV w/Dr. Carollee Herter.

## 2019-01-05 NOTE — Telephone Encounter (Signed)
PA initiated via Covermymeds; KEY: [id number]. Awaiting determination.

## 2019-01-08 NOTE — Telephone Encounter (Signed)
PA approved. Effective 01/05/2019-01/05/2020.

## 2019-02-01 ENCOUNTER — Other Ambulatory Visit: Payer: Self-pay | Admitting: Family Medicine

## 2019-02-01 DIAGNOSIS — F418 Other specified anxiety disorders: Secondary | ICD-10-CM

## 2019-02-01 NOTE — Telephone Encounter (Signed)
Pharmacy requesting a #90 day request Last refill was on 01/04/2019  #30 with 3 refills Last office visit on 01/04/2019

## 2019-04-18 ENCOUNTER — Other Ambulatory Visit: Payer: Self-pay

## 2019-04-18 ENCOUNTER — Ambulatory Visit (INDEPENDENT_AMBULATORY_CARE_PROVIDER_SITE_OTHER): Payer: 59 | Admitting: Family Medicine

## 2019-04-18 ENCOUNTER — Encounter: Payer: Self-pay | Admitting: Family Medicine

## 2019-04-18 VITALS — Temp 98.8°F

## 2019-04-18 DIAGNOSIS — G43809 Other migraine, not intractable, without status migrainosus: Secondary | ICD-10-CM

## 2019-04-18 DIAGNOSIS — M5441 Lumbago with sciatica, right side: Secondary | ICD-10-CM | POA: Diagnosis not present

## 2019-04-18 DIAGNOSIS — G8929 Other chronic pain: Secondary | ICD-10-CM | POA: Diagnosis not present

## 2019-04-18 DIAGNOSIS — F418 Other specified anxiety disorders: Secondary | ICD-10-CM | POA: Diagnosis not present

## 2019-04-18 MED ORDER — SUMATRIPTAN SUCCINATE 100 MG PO TABS: ORAL_TABLET | ORAL | 2 refills | Status: DC

## 2019-04-18 MED ORDER — CARISOPRODOL 350 MG PO TABS: 350.0000 mg | ORAL_TABLET | Freq: Four times a day (QID) | ORAL | 0 refills | Status: DC | PRN

## 2019-04-18 MED ORDER — TOPIRAMATE 50 MG PO TABS: 100.0000 mg | ORAL_TABLET | Freq: Two times a day (BID) | ORAL | 3 refills | Status: DC

## 2019-04-18 MED ORDER — DESVENLAFAXINE SUCCINATE ER 100 MG PO TB24: 100.0000 mg | ORAL_TABLET | Freq: Every day | ORAL | 3 refills | Status: DC

## 2019-04-18 NOTE — Progress Notes (Signed)
Virtual Visit via Video Note  I connected with Stacey Hickman on 04/18/19 at  9:40 AM EDT by a video enabled telemedicine application and verified that I am speaking with the correct person using two identifiers.  Location: Patient: office  Provider: home    I discussed the limitations of evaluation and management by telemedicine and the availability of in person appointments. The patient expressed understanding and agreed to proceed.  History of Present Illness: Pt is at work --- only complaints is low back pain that radiates to R buttock.  She has a hx of a bulging disc and has been doing a lot of house work lately.  Her muscle relaxers have expired and thinks this may be all she needs for now.  No other complaints    Her anxiety is well controlled with the meds.      Past Medical History:  Diagnosis Date  . Depression    no problems  . Dizziness   . Gestational hypertension 05/27/2014  . Headache(784.0)   . Insomnia   . Left knee injury 03/2011   was in immobilizer  . Tendonitis of foot 11/2010   Right foot   Current Outpatient Medications on File Prior to Visit  Medication Sig Dispense Refill  . ALPRAZolam (XANAX) 0.25 MG tablet Take 1 tablet (0.25 mg total) by mouth 2 (two) times daily as needed for anxiety. 20 tablet 0  . buPROPion (WELLBUTRIN XL) 300 MG 24 hr tablet Take 1 tablet (300 mg total) by mouth daily. 90 tablet 1  . fluticasone (FLONASE) 50 MCG/ACT nasal spray Place 2 sprays into both nostrils daily. 16 g 6  . ibuprofen (ADVIL,MOTRIN) 600 MG tablet Take 1 tablet (600 mg total) by mouth every 6 (six) hours. 30 tablet 0  . levonorgestrel (MIRENA) 20 MCG/24HR IUD 1 each by Intrauterine route once.     No current facility-administered medications on file prior to visit.     Observations/Objective: Vitals:   04/18/19 0906  Temp: 98.8 F (37.1 C)  pt is in NAD   Assessment and Plan: 1. Other migraine without status migrainosus, not intractable Stable  Controlled  with meds  - topiramate (TOPAMAX) 50 MG tablet; Take 2 tablets (100 mg total) by mouth 2 (two) times daily. 2 po bid  Dispense: 360 tablet; Refill: 3 - SUMAtriptan (IMITREX) 100 MG tablet; TAKE 1 TABLET BY MOUTH AS NEEDED FOR MIGRAINE AND REPEAT IN 2 HOURS IF NEEDED  Dispense: 10 tablet; Refill: 2  2. Depression with anxiety Stable --- refill pristiq and con't wellbutrin and prn xanax  - desvenlafaxine (PRISTIQ) 100 MG 24 hr tablet; Take 1 tablet (100 mg total) by mouth daily.  Dispense: 90 tablet; Refill: 3  3. Chronic low back pain with right-sided sciatica, unspecified back pain laterality Improving slowly Refill muscle relaxer  rto if symptoms do not improve  - carisoprodol (SOMA) 350 MG tablet; Take 1 tablet (350 mg total) by mouth 4 (four) times daily as needed for muscle spasms.  Dispense: 30 tablet; Refill: 0   Follow Up Instructions:    I discussed the assessment and treatment plan with the patient. The patient was provided an opportunity to ask questions and all were answered. The patient agreed with the plan and demonstrated an understanding of the instructions.   The patient was advised to call back or seek an in-person evaluation if the symptoms worsen or if the condition fails to improve as anticipated.  I provided 30 minutes of non-face-to-face time during  this encounter.   [name], DO

## 2019-06-07 ENCOUNTER — Other Ambulatory Visit: Payer: Self-pay | Admitting: Family Medicine

## 2019-06-07 DIAGNOSIS — F418 Other specified anxiety disorders: Secondary | ICD-10-CM

## 2019-07-11 ENCOUNTER — Other Ambulatory Visit: Payer: Self-pay | Admitting: Family Medicine

## 2019-07-11 DIAGNOSIS — G43809 Other migraine, not intractable, without status migrainosus: Secondary | ICD-10-CM

## 2019-07-14 ENCOUNTER — Other Ambulatory Visit: Payer: Self-pay | Admitting: Family Medicine

## 2019-07-14 DIAGNOSIS — F418 Other specified anxiety disorders: Secondary | ICD-10-CM

## 2019-08-30 ENCOUNTER — Encounter: Payer: Self-pay | Admitting: Family Medicine

## 2019-08-30 NOTE — Telephone Encounter (Signed)
Please see message . Thank you .

## 2019-09-08 ENCOUNTER — Other Ambulatory Visit: Payer: Self-pay | Admitting: Family Medicine

## 2019-09-08 DIAGNOSIS — F418 Other specified anxiety disorders: Secondary | ICD-10-CM

## 2019-10-23 ENCOUNTER — Other Ambulatory Visit: Payer: Self-pay | Admitting: Family Medicine

## 2019-10-23 DIAGNOSIS — F418 Other specified anxiety disorders: Secondary | ICD-10-CM

## 2019-10-24 MED ORDER — ALPRAZOLAM 0.25 MG PO TABS: 0.2500 mg | ORAL_TABLET | Freq: Two times a day (BID) | ORAL | 0 refills | Status: DC | PRN

## 2019-10-24 NOTE — Telephone Encounter (Signed)
Requesting:Alprazolam Contract:none [id number] Last Visit:04/18/2019 Next Visit:none scheduled Last Refill:12/07/2018  Please Advise

## 2019-10-26 ENCOUNTER — Ambulatory Visit: Payer: 59 | Attending: Internal Medicine

## 2019-10-26 DIAGNOSIS — Z23 Encounter for immunization: Secondary | ICD-10-CM | POA: Insufficient documentation

## 2019-10-26 NOTE — Progress Notes (Signed)
   Covid-19 Vaccination Clinic  Name:  Stacey Hickman    MRN: [id number] DOB: [birth year]  10/26/2019  [name] was observed post Covid-19 immunization for 15 minutes without incidence. She was provided with Vaccine Information Sheet and instruction to access the V-Safe system.   [name] was instructed to call 911 with any severe reactions post vaccine: Marland Kitchen Difficulty breathing  . Swelling of your face and throat  . A fast heartbeat  . A bad rash all over your body  . Dizziness and weakness    Immunizations Administered    Name Date Dose VIS Date Route   Pfizer COVID-19 Vaccine 10/26/2019  2:28 PM 0.3 mL 08/24/2019 Intramuscular   Manufacturer: [place]   Lot: [id number]   [place]: [id number]

## 2019-11-08 ENCOUNTER — Other Ambulatory Visit: Payer: Self-pay | Admitting: Family Medicine

## 2019-11-08 DIAGNOSIS — F418 Other specified anxiety disorders: Secondary | ICD-10-CM

## 2019-11-20 ENCOUNTER — Ambulatory Visit: Payer: 59 | Attending: Internal Medicine

## 2019-11-20 DIAGNOSIS — Z23 Encounter for immunization: Secondary | ICD-10-CM | POA: Insufficient documentation

## 2019-11-20 NOTE — Progress Notes (Signed)
   Covid-19 Vaccination Clinic  Name:  Stacey Hickman    MRN: [id number] DOB: 02-03-1971  11/20/2019  [name] was observed post Covid-19 immunization for 15 minutes without incident. She was provided with Vaccine Information Sheet and instruction to access the V-Safe system.   [name] was instructed to call 911 with any severe reactions post vaccine: Marland Kitchen Difficulty breathing  . Swelling of face and throat  . A fast heartbeat  . A bad rash all over body  . Dizziness and weakness   Immunizations Administered    Name Date Dose VIS Date Route   Pfizer COVID-19 Vaccine 11/20/2019  1:35 PM 0.3 mL 08/24/2019 Intramuscular   Manufacturer: [place]   Lot: [id number]   [place]: [id number]

## 2019-12-11 ENCOUNTER — Other Ambulatory Visit: Payer: Self-pay | Admitting: Family Medicine

## 2019-12-11 DIAGNOSIS — F418 Other specified anxiety disorders: Secondary | ICD-10-CM

## 2020-02-14 ENCOUNTER — Other Ambulatory Visit: Payer: Self-pay | Admitting: Family Medicine

## 2020-02-14 DIAGNOSIS — F418 Other specified anxiety disorders: Secondary | ICD-10-CM

## 2020-03-18 ENCOUNTER — Other Ambulatory Visit: Payer: Self-pay | Admitting: Family Medicine

## 2020-03-18 DIAGNOSIS — G43809 Other migraine, not intractable, without status migrainosus: Secondary | ICD-10-CM

## 2020-03-18 DIAGNOSIS — F418 Other specified anxiety disorders: Secondary | ICD-10-CM

## 2020-04-10 ENCOUNTER — Other Ambulatory Visit: Payer: Self-pay | Admitting: Family Medicine

## 2020-04-10 DIAGNOSIS — G43809 Other migraine, not intractable, without status migrainosus: Secondary | ICD-10-CM

## 2020-05-05 ENCOUNTER — Other Ambulatory Visit: Payer: Self-pay | Admitting: Family Medicine

## 2020-05-05 DIAGNOSIS — G43809 Other migraine, not intractable, without status migrainosus: Secondary | ICD-10-CM

## 2020-05-22 ENCOUNTER — Other Ambulatory Visit: Payer: Self-pay

## 2020-05-22 ENCOUNTER — Encounter: Payer: Self-pay | Admitting: Family Medicine

## 2020-05-22 ENCOUNTER — Ambulatory Visit (INDEPENDENT_AMBULATORY_CARE_PROVIDER_SITE_OTHER): Payer: 59 | Admitting: Family Medicine

## 2020-05-22 DIAGNOSIS — G43809 Other migraine, not intractable, without status migrainosus: Secondary | ICD-10-CM | POA: Diagnosis not present

## 2020-05-22 DIAGNOSIS — F418 Other specified anxiety disorders: Secondary | ICD-10-CM

## 2020-05-22 MED ORDER — TOPIRAMATE 50 MG PO TABS: 100.0000 mg | ORAL_TABLET | Freq: Two times a day (BID) | ORAL | 1 refills | Status: DC

## 2020-05-22 MED ORDER — ALPRAZOLAM 0.25 MG PO TABS: 0.2500 mg | ORAL_TABLET | Freq: Two times a day (BID) | ORAL | 0 refills | Status: DC | PRN

## 2020-05-22 MED ORDER — BUPROPION HCL ER (XL) 300 MG PO TB24: 300.0000 mg | ORAL_TABLET | Freq: Every day | ORAL | 3 refills | Status: DC

## 2020-05-22 MED ORDER — DESVENLAFAXINE SUCCINATE ER 100 MG PO TB24: 100.0000 mg | ORAL_TABLET | Freq: Every day | ORAL | 3 refills | Status: DC

## 2020-05-22 NOTE — Patient Instructions (Signed)

## 2020-05-25 DIAGNOSIS — F418 Other specified anxiety disorders: Secondary | ICD-10-CM | POA: Insufficient documentation

## 2020-05-25 NOTE — Progress Notes (Signed)
Patient ID: Stacey Hickman, female    DOB: April 10, 1971  Age: 49 y.o. MRN: [id number]    Subjective:  Subjective  HPI Stacey Hickman presents for f/u anxeity and migraines.   Pt is happy with meds   No complaints   Review of Systems  Constitutional: Negative for appetite change, diaphoresis, fatigue and unexpected weight change.  Eyes: Negative for pain, redness and visual disturbance.  Respiratory: Negative for cough, chest tightness, shortness of breath and wheezing.   Cardiovascular: Negative for chest pain, palpitations and leg swelling.  Endocrine: Negative for cold intolerance, heat intolerance, polydipsia, polyphagia and polyuria.  Genitourinary: Negative for difficulty urinating, dysuria and frequency.  Neurological: Negative for dizziness, light-headedness, numbness and headaches.    History Past Medical History:  Diagnosis Date  . Depression    no problems  . Dizziness   . Gestational hypertension 05/27/2014  . Headache(784.0)   . Insomnia   . Left knee injury 03/2011   was in immobilizer  . Tendonitis of foot 11/2010   Right foot    She has a past surgical history that includes Other surgical history; Wisdom tooth extraction; and IUD removal.   Her family history includes Diabetes in her brother, brother, father, mother, and paternal grandfather; Hyperlipidemia in her brother; Hypertension in her brother; Hypothyroidism in her brother and mother; Obesity in her brother, father, mother, and sister.She reports that she has never smoked. She has never used smokeless tobacco. She reports current alcohol use. She reports that she does not use drugs.  Current Outpatient Medications on File Prior to Visit  Medication Sig Dispense Refill  . fluticasone (FLONASE) 50 MCG/ACT nasal spray Place 2 sprays into both nostrils daily. 16 g 6  . ibuprofen (ADVIL,MOTRIN) 600 MG tablet Take 1 tablet (600 mg total) by mouth every 6 (six) hours. 30 tablet 0  . levonorgestrel (MIRENA) 20 MCG/24HR  IUD 1 each by Intrauterine route once.    . SUMAtriptan (IMITREX) 100 MG tablet TAKE 1 TABLET BY MOUTH AS NEEDED FOR MIGRAINE AND REPEAT IN 2 HOURS IF NEEDED 9 tablet 3   No current facility-administered medications on file prior to visit.     Objective:  Objective  Physical Exam Vitals and nursing note reviewed.  Constitutional:      Appearance: She is well-developed.  HENT:     Head: Normocephalic and atraumatic.  Eyes:     Conjunctiva/sclera: Conjunctivae normal.  Neck:     Thyroid: No thyromegaly.     Vascular: No carotid bruit or JVD.  Cardiovascular:     Rate and Rhythm: Normal rate and regular rhythm.     Heart sounds: Normal heart sounds. No murmur heard.   Pulmonary:     Effort: Pulmonary effort is normal. No respiratory distress.     Breath sounds: Normal breath sounds. No wheezing or rales.  Chest:     Chest wall: No tenderness.  Musculoskeletal:     Cervical back: Normal range of motion and neck supple.  Neurological:     Mental Status: She is alert and oriented to person, place, and time.    BP 100/80 (BP Location: Right Arm, Patient Position: Sitting, Cuff Size: Normal)   Pulse 76   Temp 98.2 F (36.8 C) (Oral)   Resp 18   Ht 5\' 3"  (1.6 m)   Wt 171 lb (77.6 kg)   SpO2 97%   BMI 30.29 kg/m  Wt Readings from Last 3 Encounters:  05/22/20 171 lb (77.6 kg)  03/02/18  187 lb 6.4 oz (85 kg)  01/30/18 191 lb 3.2 oz (86.7 kg)     Lab Results  Component Value Date   WBC 6.3 02/01/2018   HGB 14.2 02/01/2018   HCT 42.2 02/01/2018   PLT 156.0 02/01/2018   GLUCOSE 97 02/01/2018   CHOL 146 02/01/2018   TRIG 176.0 (H) 02/01/2018   HDL 29.80 (L) 02/01/2018   LDLCALC 81 02/01/2018   ALT 12 02/01/2018   AST 12 02/01/2018   NA 143 02/01/2018   K 3.8 02/01/2018   CL 113 (H) 02/01/2018   CREATININE 0.89 02/01/2018   BUN 14 02/01/2018   CO2 24 02/01/2018   TSH 3.62 02/08/2018    DG Chest 2 View  Result Date: 11/17/2016 CLINICAL DATA:  History of  bronchitis does not feel well EXAM: CHEST  2 VIEW COMPARISON:  None. FINDINGS: The heart size and mediastinal contours are within normal limits. Both lungs are clear. The visualized skeletal structures are unremarkable. IMPRESSION: No active cardiopulmonary disease. Electronically Signed   By: Donavan Foil M.D.   On: 11/17/2016 17:57     Assessment & Plan:  Plan  I have discontinued Aalyiah M. Buschman's carisoprodol. I am also having her maintain her ibuprofen, levonorgestrel, fluticasone, SUMAtriptan, topiramate, desvenlafaxine, buPROPion, and ALPRAZolam.  Meds ordered this encounter  Medications  . topiramate (TOPAMAX) 50 MG tablet    Sig: Take 2 tablets (100 mg total) by mouth 2 (two) times daily.    Dispense:  120 tablet    Refill:  1  . desvenlafaxine (PRISTIQ) 100 MG 24 hr tablet    Sig: Take 1 tablet (100 mg total) by mouth daily.    Dispense:  90 tablet    Refill:  3    Requested drug refills are authorized, however, the patient needs further evaluation and/or laboratory testing before further refills are given. Ask her to make an appointment for this.  Marland Kitchen buPROPion (WELLBUTRIN XL) 300 MG 24 hr tablet    Sig: Take 1 tablet (300 mg total) by mouth daily.    Dispense:  90 tablet    Refill:  3    Requested drug refills are authorized, however, the patient needs further evaluation and/or laboratory testing before further refills are given. Ask her to make an appointment for this.  . ALPRAZolam (XANAX) 0.25 MG tablet    Sig: Take 1 tablet (0.25 mg total) by mouth 2 (two) times daily as needed for anxiety.    Dispense:  20 tablet    Refill:  0    Problem List Items Addressed This Visit      Unprioritized   Migraine   Relevant Medications   topiramate (TOPAMAX) 50 MG tablet   desvenlafaxine (PRISTIQ) 100 MG 24 hr tablet   buPROPion (WELLBUTRIN XL) 300 MG 24 hr tablet    Other Visit Diagnoses    Depression with anxiety       Relevant Medications   desvenlafaxine (PRISTIQ) 100 MG  24 hr tablet   buPROPion (WELLBUTRIN XL) 300 MG 24 hr tablet   ALPRAZolam (XANAX) 0.25 MG tablet      Follow-up: Return in about 6 months (around 11/19/2020), or if symptoms worsen or fail to improve, for annual exam, fasting.  [name], DO

## 2020-05-25 NOTE — Assessment & Plan Note (Signed)
Stable. Refill meds

## 2020-06-15 ENCOUNTER — Other Ambulatory Visit: Payer: Self-pay | Admitting: Family Medicine

## 2020-06-15 DIAGNOSIS — G43809 Other migraine, not intractable, without status migrainosus: Secondary | ICD-10-CM

## 2020-07-17 ENCOUNTER — Other Ambulatory Visit: Payer: Self-pay | Admitting: Family Medicine

## 2020-07-17 DIAGNOSIS — G43809 Other migraine, not intractable, without status migrainosus: Secondary | ICD-10-CM

## 2020-08-12 ENCOUNTER — Other Ambulatory Visit: Payer: Self-pay | Admitting: Family Medicine

## 2020-08-12 DIAGNOSIS — G43809 Other migraine, not intractable, without status migrainosus: Secondary | ICD-10-CM

## 2020-11-02 ENCOUNTER — Other Ambulatory Visit: Payer: Self-pay | Admitting: Family Medicine

## 2020-11-02 DIAGNOSIS — F418 Other specified anxiety disorders: Secondary | ICD-10-CM

## 2020-11-03 NOTE — Telephone Encounter (Signed)
///  Requesting: xanax  Contract:n/a UDS:n/a Last Visit:05/22/2020 Next Visit:11/20/2020 Last Refill:05/22/2020  Please Advise

## 2020-11-20 ENCOUNTER — Encounter: Payer: Self-pay | Admitting: Family Medicine

## 2020-11-20 ENCOUNTER — Ambulatory Visit (INDEPENDENT_AMBULATORY_CARE_PROVIDER_SITE_OTHER): Payer: 59 | Admitting: Family Medicine

## 2020-11-20 ENCOUNTER — Telehealth: Payer: Self-pay

## 2020-11-20 ENCOUNTER — Other Ambulatory Visit: Payer: Self-pay

## 2020-11-20 ENCOUNTER — Other Ambulatory Visit: Payer: Self-pay | Admitting: Family Medicine

## 2020-11-20 VITALS — BP 122/84 | HR 87 | Temp 98.2°F | Resp 18 | Ht 63.0 in | Wt 177.0 lb

## 2020-11-20 DIAGNOSIS — Z1211 Encounter for screening for malignant neoplasm of colon: Secondary | ICD-10-CM | POA: Diagnosis not present

## 2020-11-20 DIAGNOSIS — Z1159 Encounter for screening for other viral diseases: Secondary | ICD-10-CM | POA: Diagnosis not present

## 2020-11-20 DIAGNOSIS — F418 Other specified anxiety disorders: Secondary | ICD-10-CM | POA: Diagnosis not present

## 2020-11-20 DIAGNOSIS — Z0000 Encounter for general adult medical examination without abnormal findings: Secondary | ICD-10-CM | POA: Diagnosis not present

## 2020-11-20 DIAGNOSIS — Z833 Family history of diabetes mellitus: Secondary | ICD-10-CM

## 2020-11-20 LAB — VITAMIN D 25 HYDROXY (VIT D DEFICIENCY, FRACTURES): VITD: 41.88 ng/mL (ref 30.00–100.00)

## 2020-11-20 LAB — CBC WITH DIFFERENTIAL/PLATELET
Basophils Absolute: 0 10*3/uL (ref 0.0–0.1)
Basophils Relative: 0.6 % (ref 0.0–3.0)
Eosinophils Absolute: 0.1 10*3/uL (ref 0.0–0.7)
Eosinophils Relative: 1 % (ref 0.0–5.0)
HCT: 47.1 % — ABNORMAL HIGH (ref 36.0–46.0)
Hemoglobin: 15.8 g/dL — ABNORMAL HIGH (ref 12.0–15.0)
Lymphocytes Relative: 22.7 % (ref 12.0–46.0)
Lymphs Abs: 1.4 10*3/uL (ref 0.7–4.0)
MCHC: 33.5 g/dL (ref 30.0–36.0)
MCV: 91.7 fl (ref 78.0–100.0)
Monocytes Absolute: 0.3 10*3/uL (ref 0.1–1.0)
Monocytes Relative: 4.2 % (ref 3.0–12.0)
Neutro Abs: 4.4 10*3/uL (ref 1.4–7.7)
Neutrophils Relative %: 71.5 % (ref 43.0–77.0)
Platelets: 167 10*3/uL (ref 150.0–400.0)
RBC: 5.13 Mil/uL — ABNORMAL HIGH (ref 3.87–5.11)
RDW: 12.6 % (ref 11.5–15.5)
WBC: 6.2 10*3/uL (ref 4.0–10.5)

## 2020-11-20 LAB — LIPID PANEL
Cholesterol: 167 mg/dL (ref 0–200)
HDL: 40.6 mg/dL (ref >39.00)
LDL Cholesterol: 97 mg/dL (ref 0–99)
NonHDL: 126.57
Total CHOL/HDL Ratio: 4
Triglycerides: 146 mg/dL (ref 0.0–149.0)
VLDL: 29.2 mg/dL (ref 0.0–40.0)

## 2020-11-20 LAB — COMPREHENSIVE METABOLIC PANEL
ALT: 29 U/L (ref 0–35)
AST: 22 U/L (ref 0–37)
Albumin: 4.4 g/dL (ref 3.5–5.2)
Alkaline Phosphatase: 47 U/L (ref 39–117)
BUN: 17 mg/dL (ref 6–23)
CO2: 28 mEq/L (ref 19–32)
Calcium: 9.6 mg/dL (ref 8.4–10.5)
Chloride: 108 mEq/L (ref 96–112)
Creatinine, Ser: 1.11 mg/dL (ref 0.40–1.20)
GFR: 58.18 mL/min — ABNORMAL LOW (ref >60.00)
Glucose, Bld: 89 mg/dL (ref 70–99)
Potassium: 4.1 mEq/L (ref 3.5–5.1)
Sodium: 141 mEq/L (ref 135–145)
Total Bilirubin: 0.6 mg/dL (ref 0.2–1.2)
Total Protein: 7.5 g/dL (ref 6.0–8.3)

## 2020-11-20 LAB — HEMOGLOBIN A1C: Hgb A1c MFr Bld: 4.9 % (ref 4.6–6.5)

## 2020-11-20 LAB — TSH: TSH: 4.58 u[IU]/mL — ABNORMAL HIGH (ref 0.35–4.50)

## 2020-11-20 MED ORDER — BUPROPION HCL ER (XL) 150 MG PO TB24: 150.0000 mg | ORAL_TABLET | Freq: Every day | ORAL | 0 refills | Status: DC

## 2020-11-20 MED ORDER — ARIPIPRAZOLE 2 MG PO TABS: 2.0000 mg | ORAL_TABLET | Freq: Every day | ORAL | 0 refills | Status: DC

## 2020-11-20 MED ORDER — WEGOVY 0.25 MG/0.5ML ~~LOC~~ SOAJ: 0.2500 mg | SUBCUTANEOUS | 0 refills | Status: DC

## 2020-11-20 NOTE — Patient Instructions (Signed)
Preventive Care 50-50 Years Old, Female Preventive care refers to lifestyle choices and visits with your health care provider that can promote health and wellness. This includes:  A yearly physical exam. This is also called an annual wellness visit.  Regular dental and eye exams.  Immunizations.  Screening for certain conditions.  Healthy lifestyle choices, such as: ? Eating a healthy diet. ? Getting regular exercise. ? Not using drugs or products that contain nicotine and tobacco. ? Limiting alcohol use. What can I expect for my preventive care visit? Physical exam Your health care provider will check your:  Height and weight. These may be used to calculate your BMI (body mass index). BMI is a measurement that tells if you are at a healthy weight.  Heart rate and blood pressure.  Body temperature.  Skin for abnormal spots. Counseling Your health care provider may ask you questions about your:  Past medical problems.  Family's medical history.  Alcohol, tobacco, and drug use.  Emotional well-being.  Home life and relationship well-being.  Sexual activity.  Diet, exercise, and sleep habits.  Work and work Statistician.  Access to firearms.  Method of birth control.  Menstrual cycle.  Pregnancy history. What immunizations do I need? Vaccines are usually given at various ages, according to a schedule. Your health care provider will recommend vaccines for you based on your age, medical history, and lifestyle or other factors, such as travel or where you work.   What tests do I need? Blood tests  Lipid and cholesterol levels. These may be checked every 5 years, or more often if you are over 3 years old.  Hepatitis C test.  Hepatitis B test. Screening  Lung cancer screening. You may have this screening every year starting at age 50 if you have a 30-pack-year history of smoking and currently smoke or have quit within the past 15 years.  Colorectal cancer  screening. ? All adults should have this screening starting at age 50 and continuing until age 17. ? Your health care provider may recommend screening at age 50 if you are at increased risk. ? You will have tests every 1-10 years, depending on your results and the type of screening test.  Diabetes screening. ? This is done by checking your blood sugar (glucose) after you have not eaten for a while (fasting). ? You may have this done every 1-3 years.  Mammogram. ? This may be done every 1-2 years. ? Talk with your health care provider about when you should start having regular mammograms. This may depend on whether you have a family history of breast cancer.  BRCA-related cancer screening. This may be done if you have a family history of breast, ovarian, tubal, or peritoneal cancers.  Pelvic exam and Pap test. ? This may be done every 3 years starting at age 10. ? Starting at age 11, this may be done every 5 years if you have a Pap test in combination with an HPV test. Other tests  STD (sexually transmitted disease) testing, if you are at risk.  Bone density scan. This is done to screen for osteoporosis. You may have this scan if you are at high risk for osteoporosis. Talk with your health care provider about your test results, treatment options, and if necessary, the need for more tests. Follow these instructions at home: Eating and drinking  Eat a diet that includes fresh fruits and vegetables, whole grains, lean protein, and low-fat dairy products.  Take vitamin and mineral supplements  as recommended by your health care provider.  Do not drink alcohol if: ? Your health care provider tells you not to drink. ? You are pregnant, may be pregnant, or are planning to become pregnant.  If you drink alcohol: ? Limit how much you have to 0-1 drink a day. ? Be aware of how much alcohol is in your drink. In the [place] one drink equals one 12 oz bottle of beer (355 mL), one 5 oz glass of  wine (148 mL), or one 1 oz glass of hard liquor (44 mL).   Lifestyle  Take daily care of your teeth and gums. Brush your teeth every morning and night with fluoride toothpaste. Floss one time each day.  Stay active. Exercise for at least 30 minutes 5 or more days each week.  Do not use any products that contain nicotine or tobacco, such as cigarettes, e-cigarettes, and chewing tobacco. If you need help quitting, ask your health care provider.  Do not use drugs.  If you are sexually active, practice safe sex. Use a condom or other form of protection to prevent STIs (sexually transmitted infections).  If you do not wish to become pregnant, use a form of birth control. If you plan to become pregnant, see your health care provider for a prepregnancy visit.  If told by your health care provider, take low-dose aspirin daily starting at age 50.  Find healthy ways to cope with stress, such as: ? Meditation, yoga, or listening to music. ? Journaling. ? Talking to a trusted person. ? Spending time with friends and family. Safety  Always wear your seat belt while driving or riding in a vehicle.  Do not drive: ? If you have been drinking alcohol. Do not ride with someone who has been drinking. ? When you are tired or distracted. ? While texting.  Wear a helmet and other protective equipment during sports activities.  If you have firearms in your house, make sure you follow all gun safety procedures. What's next?  Visit your health care provider once a year for an annual wellness visit.  Ask your health care provider how often you should have your eyes and teeth checked.  Stay up to date on all vaccines. This information is not intended to replace advice given to you by your health care provider. Make sure you discuss any questions you have with your health care provider. Document Revised: 06/03/2020 Document Reviewed: 05/11/2018 Elsevier Patient Education  2021 Elsevier Inc.  

## 2020-11-20 NOTE — Progress Notes (Signed)
Subjective:     Stacey Hickman is a 50 y.o. female and is here for a comprehensive physical exam. The patient reports problems - increasing depression and anxiety due to family situation .  Her son that has down syndrome fell on the ice and broke his back --- had surgery and was in rehab until recently --- she has been under a lot of stress  She is also struggling to lose weight and would like to see if he ins will pay for wegovy.   Social History   Socioeconomic History  . Marital status: Married    Spouse name: Not on file  . Number of children: 3  . Years of education: Not on file  . Highest education level: Not on file  Occupational History  . Occupation: PHARMACIST    Comment: [employer]  Tobacco Use  . Smoking status: Never Smoker  . Smokeless tobacco: Never Used  Substance and Sexual Activity  . Alcohol use: Yes    Comment: occ, not with preg  . Drug use: No  . Sexual activity: Yes    Partners: Male  Other Topics Concern  . Not on file  Social History Narrative   Exercising--  30 min 2x a week--- walking   Social Determinants of Health   Financial Resource Strain: Not on file  Food Insecurity: Not on file  Transportation Needs: Not on file  Physical Activity: Not on file  Stress: Not on file  Social Connections: Not on file  Intimate Partner Violence: Not on file   Health Maintenance  Topic Date Due  . Hepatitis C Screening  Never done  . COLONOSCOPY (Pts 45-23yrs Insurance coverage will need to be confirmed)  Never done  . PAP SMEAR-Modifier  01/13/2021  . TETANUS/TDAP  05/28/2024  . INFLUENZA VACCINE  Completed  . COVID-19 Vaccine  Completed  . HIV Screening  Completed  . HPV VACCINES  Aged Out    The following portions of the patient's history were reviewed and updated as appropriate:  She  has a past medical history of Depression, Dizziness, Gestational hypertension (05/27/2014), Headache(784.0), Insomnia, Left knee injury (03/2011), and Tendonitis of foot  (11/2010). She does not have any pertinent problems on file. She  has a past surgical history that includes Other surgical history; Wisdom tooth extraction; and IUD removal. Her family history includes Diabetes in her brother, brother, father, mother, and paternal grandfather; Hyperlipidemia in her brother; Hypertension in her brother; Hypothyroidism in her brother and mother; Obesity in her brother, father, mother, and sister. She  reports that she has never smoked. She has never used smokeless tobacco. She reports current alcohol use. She reports that she does not use drugs. She has a current medication list which includes the following prescription(s): alprazolam, aripiprazole, bupropion, desvenlafaxine, ibuprofen, levonorgestrel, wegovy, sumatriptan, and topiramate. Current Outpatient Medications on File Prior to Visit  Medication Sig Dispense Refill  . ALPRAZolam (XANAX) 0.25 MG tablet TAKE 1 TABLET (0.25 MG TOTAL) BY MOUTH 2 (TWO) TIMES DAILY AS NEEDED FOR ANXIETY. 20 tablet 0  . desvenlafaxine (PRISTIQ) 100 MG 24 hr tablet Take 1 tablet (100 mg total) by mouth daily. 90 tablet 3  . ibuprofen (ADVIL,MOTRIN) 600 MG tablet Take 1 tablet (600 mg total) by mouth every 6 (six) hours. 30 tablet 0  . levonorgestrel (MIRENA) 20 MCG/24HR IUD 1 each by Intrauterine route once.    . SUMAtriptan (IMITREX) 100 MG tablet Take 1 tablet (100 mg total) by mouth every 2 (two) hours  as needed for migraine. Max 2 per 24hrs 10 tablet 3  . topiramate (TOPAMAX) 50 MG tablet Take 2 tablets (100 mg total) by mouth 2 (two) times daily. 120 tablet 5   No current facility-administered medications on file prior to visit.   She has No Known Allergies..  Review of Systems Review of Systems  Constitutional: Negative for activity change, appetite change and fatigue.  HENT: Negative for hearing loss, congestion, tinnitus and ear discharge.  dentist q67m Eyes: Negative for visual disturbance (see optho q1y -- vision  corrected to 20/20 with glasses).  Respiratory: Negative for cough, chest tightness and shortness of breath.   Cardiovascular: Negative for chest pain, palpitations and leg swelling.  Gastrointestinal: Negative for abdominal pain, diarrhea, constipation and abdominal distention.  Genitourinary: Negative for urgency, frequency, decreased urine volume and difficulty urinating.  Musculoskeletal: Negative for back pain, arthralgias and gait problem.  Skin: Negative for color change, pallor and rash.  Neurological: Negative for dizziness, light-headedness, numbness and headaches.  Hematological: Negative for adenopathy. Does not bruise/bleed easily.  Psychiatric/Behavioral: Negative for suicidal ideas, confusion, sleep disturbance, self-injury, dysphoric mood, decreased concentration and agitation.       Objective:    BP 122/84 (BP Location: Left Arm, Patient Position: Sitting, Cuff Size: Normal)   Pulse 87   Temp 98.2 F (36.8 C) (Oral)   Resp 18   Ht 5\' 3"  (1.6 m)   Wt 177 lb (80.3 kg)   SpO2 99%   BMI 31.35 kg/m  General appearance: alert, cooperative, appears stated age and no distress Head: Normocephalic, without obvious abnormality, atraumatic Eyes: negative findings: lids and lashes normal and conjunctivae and sclerae normal Ears: normal TM's and external ear canals both ears Neck: no adenopathy, no carotid bruit, no JVD, supple, symmetrical, trachea midline and thyroid not enlarged, symmetric, no tenderness/mass/nodules Back: symmetric, no curvature. ROM normal. No CVA tenderness. Lungs: clear to auscultation bilaterally Heart: regular rate and rhythm, S1, S2 normal, no murmur, click, rub or gallop Abdomen: soft, non-tender; bowel sounds normal; no masses,  no organomegaly Extremities: extremities normal, atraumatic, no cyanosis or edema Pulses: 2+ and symmetric Skin: Skin color, texture, turgor normal. No rashes or lesions Lymph nodes: Cervical, supraclavicular, and  axillary nodes normal. Neurologic: Alert and oriented X 3, normal strength and tone. Normal symmetric reflexes. Normal coordination and gait    Assessment:    Healthy female exam.      Plan:    ghm utd Check labs  See After Visit Summary for Counseling Recommendations    . 1. Depression with anxiety con't ssri and lower wellbutrin add abilify 2 mg and f/u in 2-4 weeks  con't with counseling  - ARIPiprazole (ABILIFY) 2 MG tablet; Take 1 tablet (2 mg total) by mouth daily.  Dispense: 30 tablet; Refill: 0 - buPROPion (WELLBUTRIN XL) 150 MG 24 hr tablet; Take 1 tablet (150 mg total) by mouth daily.  Dispense: 30 tablet; Refill: 0 - TSH  2. Morbid obesity ([place]) Check labs If wegovy not covered consider metformin  - Lipid panel - CBC with Differential/Platelet - Comprehensive metabolic panel - TSH - Insulin, random - Vitamin D (25 hydroxy) - Semaglutide-Weight Management (WEGOVY) 0.25 MG/0.5ML SOAJ; Inject 0.25 mg into the skin once a week.  Dispense: 2 mL; Refill: 0 - Hemoglobin A1c  3. Preventative health care See labs  - Lipid panel - CBC with Differential/Platelet - Comprehensive metabolic panel - TSH - Insulin, random  4. Family history of diabetes mellitus Check labs  -  Hemoglobin A1c  5. Need for hepatitis C screening test   - Hepatitis C antibody  6. Colon cancer screening   - Ambulatory referral to Gastroenterology

## 2020-11-20 NOTE — Telephone Encounter (Signed)
Pt was seen in office today. [facility] test was collected/ordered. Sample was packaged and placed up front for [employer] to pick up today 11/20/2020. Consent and insurance info were in the package.   Order number for pickup is [id number].

## 2020-11-21 LAB — INSULIN, RANDOM: Insulin: 5 u[IU]/mL

## 2020-11-21 LAB — HEPATITIS C ANTIBODY
Hepatitis C Ab: NONREACTIVE
SIGNAL TO CUT-OFF: 0 (ref <1.00)

## 2020-11-24 ENCOUNTER — Encounter: Payer: Self-pay | Admitting: Family Medicine

## 2020-11-24 NOTE — Telephone Encounter (Signed)
Disregard. Wrong insurance card sent.

## 2020-11-24 NOTE — Telephone Encounter (Signed)
Pt uploaded current insurance card.  [facility] order updated and card emailed to [employer].

## 2020-12-03 ENCOUNTER — Telehealth: Payer: Self-pay

## 2020-12-03 NOTE — Telephone Encounter (Addendum)
PA denied:  Why was my request denied? This request was denied because you did not meet the following clinical requirements: The requested medication and/or diagnosis are not a covered benefit and excluded from coverage in accordance with the terms and conditions of your plan benefit. Therefore, the request has been administratively denied. The requested medication and/or diagnosis are not a covered benefit and are excluded from coverage in accordance with the terms and conditions of your plan benefit. Therefore, this requesy has been administratively denied.

## 2020-12-03 NOTE — Telephone Encounter (Signed)
PA sent to insurance.

## 2020-12-03 NOTE — Telephone Encounter (Signed)
Let pt know

## 2020-12-03 NOTE — Telephone Encounter (Signed)
PA: [id number] sent to plan  Key:  [id number]

## 2020-12-04 NOTE — Telephone Encounter (Signed)
Pt called. Left detailed VM

## 2020-12-13 ENCOUNTER — Other Ambulatory Visit: Payer: Self-pay | Admitting: Family Medicine

## 2020-12-13 DIAGNOSIS — F418 Other specified anxiety disorders: Secondary | ICD-10-CM

## 2020-12-18 ENCOUNTER — Other Ambulatory Visit: Payer: Self-pay | Admitting: Family Medicine

## 2020-12-18 ENCOUNTER — Encounter: Payer: Self-pay | Admitting: Family Medicine

## 2020-12-18 ENCOUNTER — Ambulatory Visit: Payer: 59 | Admitting: Family Medicine

## 2020-12-18 ENCOUNTER — Other Ambulatory Visit: Payer: Self-pay

## 2020-12-18 VITALS — BP 110/70 | HR 90 | Temp 97.7°F | Resp 18 | Ht 63.0 in | Wt 174.8 lb

## 2020-12-18 DIAGNOSIS — R7989 Other specified abnormal findings of blood chemistry: Secondary | ICD-10-CM | POA: Diagnosis not present

## 2020-12-18 DIAGNOSIS — F418 Other specified anxiety disorders: Secondary | ICD-10-CM

## 2020-12-18 DIAGNOSIS — F321 Major depressive disorder, single episode, moderate: Secondary | ICD-10-CM

## 2020-12-18 DIAGNOSIS — J302 Other seasonal allergic rhinitis: Secondary | ICD-10-CM

## 2020-12-18 DIAGNOSIS — J069 Acute upper respiratory infection, unspecified: Secondary | ICD-10-CM

## 2020-12-18 DIAGNOSIS — G43809 Other migraine, not intractable, without status migrainosus: Secondary | ICD-10-CM

## 2020-12-18 MED ORDER — FLUTICASONE PROPIONATE 50 MCG/ACT NA SUSP: 2.0000 | Freq: Every day | NASAL | 6 refills | Status: DC

## 2020-12-18 MED ORDER — ARIPIPRAZOLE 5 MG PO TABS: 5.0000 mg | ORAL_TABLET | Freq: Every day | ORAL | 1 refills | Status: DC

## 2020-12-18 MED ORDER — AZITHROMYCIN 250 MG PO TABS: ORAL_TABLET | ORAL | 0 refills | Status: DC

## 2020-12-18 NOTE — Progress Notes (Signed)
Patient ID: Stacey Hickman, female    DOB: 11-Sep-1971  Age: 50 y.o. MRN: [id number]    Subjective:  Subjective  HPI Stacey Hickman presents for office visit today. She reports that she is feeling better with her depression and anxiety since taking her prescribed medications. She expresses interest in increasing her  Abilify dosage from 2 mg to 5 mg. She denies any chest pain, SOB, fever, abdominal pain, cough, chills, sore throat, dysuria, urinary incontinence, back pain, HA, or N/VD. She states that she has schedule her colonoscopy and decided not to do her cologuard as a result. She states that she is travelling to [place] and reports taking Claritin BID for about a week at this moment to prepare.  Review of Systems  Constitutional: Negative for chills, fatigue and fever.  HENT: Negative for congestion, rhinorrhea, sinus pressure, sinus pain and sore throat.   Eyes: Negative for pain.  Respiratory: Negative for cough and shortness of breath.   Cardiovascular: Negative for chest pain, palpitations and leg swelling.  Gastrointestinal: Negative for abdominal pain, blood in stool, diarrhea, nausea and vomiting.  Genitourinary: Negative for decreased urine volume, flank pain, frequency, vaginal bleeding and vaginal discharge.  Musculoskeletal: Negative for back pain.  Neurological: Negative for headaches.    History Past Medical History:  Diagnosis Date  . Depression    no problems  . Dizziness   . Gestational hypertension 05/27/2014  . Headache(784.0)   . Insomnia   . Left knee injury 03/2011   was in immobilizer  . Tendonitis of foot 11/2010   Right foot    She has a past surgical history that includes Other surgical history; Wisdom tooth extraction; and IUD removal.   Her family history includes Diabetes in her brother, brother, father, mother, and paternal grandfather; Hyperlipidemia in her brother; Hypertension in her brother; Hypothyroidism in her brother and mother; Obesity in  her brother, father, mother, and sister.She reports that she has never smoked. She has never used smokeless tobacco. She reports current alcohol use. She reports that she does not use drugs.  Current Outpatient Medications on File Prior to Visit  Medication Sig Dispense Refill  . ALPRAZolam (XANAX) 0.25 MG tablet TAKE 1 TABLET (0.25 MG TOTAL) BY MOUTH 2 (TWO) TIMES DAILY AS NEEDED FOR ANXIETY. 20 tablet 0  . buPROPion (WELLBUTRIN XL) 150 MG 24 hr tablet TAKE 1 TABLET BY MOUTH EVERY DAY 90 tablet 1  . desvenlafaxine (PRISTIQ) 100 MG 24 hr tablet Take 1 tablet (100 mg total) by mouth daily. 90 tablet 3  . ibuprofen (ADVIL,MOTRIN) 600 MG tablet Take 1 tablet (600 mg total) by mouth every 6 (six) hours. 30 tablet 0  . levonorgestrel (MIRENA) 20 MCG/24HR IUD 1 each by Intrauterine route once.    . SUMAtriptan (IMITREX) 100 MG tablet Take 1 tablet (100 mg total) by mouth every 2 (two) hours as needed for migraine. Max 2 per 24hrs 10 tablet 3  . topiramate (TOPAMAX) 50 MG tablet Take 2 tablets (100 mg total) by mouth 2 (two) times daily. 120 tablet 5  . spironolactone (ALDACTONE) 50 MG tablet Take 50 mg by mouth daily.     No current facility-administered medications on file prior to visit.     Objective:  Objective  Physical Exam Constitutional:      General: She is not in acute distress.    Appearance: Normal appearance. She is not ill-appearing or toxic-appearing.  HENT:     Head: Normocephalic and atraumatic.  Right Ear: Tympanic membrane, ear canal and external ear normal.     Left Ear: Tympanic membrane, ear canal and external ear normal.     Nose: No congestion or rhinorrhea.  Eyes:     Extraocular Movements: Extraocular movements intact.     Pupils: Pupils are equal, round, and reactive to light.  Cardiovascular:     Rate and Rhythm: Normal rate and regular rhythm.     Pulses: Normal pulses.     Heart sounds: Normal heart sounds. No murmur heard.   Pulmonary:     Effort:  Pulmonary effort is normal. No respiratory distress.     Breath sounds: Normal breath sounds. No wheezing, rhonchi or rales.  Abdominal:     General: Bowel sounds are normal.     Palpations: Abdomen is soft. There is no mass.     Tenderness: There is no abdominal tenderness. There is no guarding.     Hernia: No hernia is present.  Musculoskeletal:        General: Normal range of motion.     Cervical back: Normal range of motion and neck supple.  Skin:    General: Skin is warm and dry.  Neurological:     Mental Status: She is alert and oriented to person, place, and time.  Psychiatric:        Behavior: Behavior normal.    BP 110/70 (BP Location: Right Arm, Patient Position: Sitting, Cuff Size: Normal)   Pulse 90   Temp 97.7 F (36.5 C) (Oral)   Resp 18   Ht 5\' 3"  (1.6 m)   Wt 174 lb 12.8 oz (79.3 kg)   SpO2 98%   BMI 30.96 kg/m  Wt Readings from Last 3 Encounters:  12/18/20 174 lb 12.8 oz (79.3 kg)  11/20/20 177 lb (80.3 kg)  05/22/20 171 lb (77.6 kg)     Lab Results  Component Value Date   WBC 6.2 11/20/2020   HGB 15.8 (H) 11/20/2020   HCT 47.1 (H) 11/20/2020   PLT 167.0 11/20/2020   GLUCOSE 89 11/20/2020   CHOL 167 11/20/2020   TRIG 146.0 11/20/2020   HDL 40.60 11/20/2020   LDLCALC 97 11/20/2020   ALT 29 11/20/2020   AST 22 11/20/2020   NA 141 11/20/2020   K 4.1 11/20/2020   CL 108 11/20/2020   CREATININE 1.11 11/20/2020   BUN 17 11/20/2020   CO2 28 11/20/2020   TSH 4.58 (H) 11/20/2020   HGBA1C 4.9 11/20/2020    DG Chest 2 View  Result Date: 11/17/2016 CLINICAL DATA:  History of bronchitis does not feel well EXAM: CHEST  2 VIEW COMPARISON:  None. FINDINGS: The heart size and mediastinal contours are within normal limits. Both lungs are clear. The visualized skeletal structures are unremarkable. IMPRESSION: No active cardiopulmonary disease. Electronically Signed   By: Donavan Foil M.D.   On: 11/17/2016 17:57     Assessment & Plan:  Plan    Meds  ordered this encounter  Medications  . ARIPiprazole (ABILIFY) 5 MG tablet    Sig: Take 1 tablet (5 mg total) by mouth daily.    Dispense:  30 tablet    Refill:  1  . fluticasone (FLONASE) 50 MCG/ACT nasal spray    Sig: Place 2 sprays into both nostrils daily.    Dispense:  16 g    Refill:  6  . azithromycin (ZITHROMAX Z-PAK) 250 MG tablet    Sig: As directed    Dispense:  6 each  Refill:  0    Problem List Items Addressed This Visit      Unprioritized   Depression with anxiety - Primary   Relevant Medications   ARIPiprazole (ABILIFY) 5 MG tablet    Other Visit Diagnoses    Seasonal allergies       Relevant Medications   fluticasone (FLONASE) 50 MCG/ACT nasal spray   Viral upper respiratory tract infection       Relevant Medications   azithromycin (ZITHROMAX Z-PAK) 250 MG tablet   Abnormal TSH       Relevant Orders   Thyroid Panel With TSH      Follow-up: Return in about 3 months (around 03/19/2021), or if symptoms worsen or fail to improve, for f/u depression / anxiety -- can be virtual if pt wishes.   I,David Hanna,acting as a Education administrator for [employer], DO.,have documented all relevant documentation on the behalf of [name], DO,as directed by  [name], DO while in the presence of [place], DO, have reviewed all documentation for this visit. The documentation on 12/18/20 for the exam, diagnosis, procedures, and orders are all accurate and complete.

## 2020-12-18 NOTE — Assessment & Plan Note (Addendum)
Improving -- inc abilify to 5 mg daily  F/u 1 month or sooner prn  con't wellbutrin and pristiq Also gave pt genetic test

## 2020-12-18 NOTE — Patient Instructions (Signed)
[email or web address]">  Depression Screening Depression screening is a tool that your health care provider can use to learn if you have symptoms of depression. Depression is a common condition with many symptoms that are also often found in other conditions. Depression is treatable, but it must first be diagnosed. You may not know that certain feelings, thoughts, and behaviors that you are having can be symptoms of depression. Taking a depression screening test can help you and your health care provider decide if you need more assessment, or if you should be referred to a mental health care provider. What are the screening tests?  You may have a physical exam to see if another condition is affecting your mental health. You may have a blood or urine sample taken during the physical exam.  You may be interviewed using a screening tool that was developed from research, such as one of these: ? Patient Health Questionnaire (PHQ). This is a set of either 2 or 9 questions. A health care provider who has been trained to score this screening test uses a guide to assess if your symptoms suggest that you may have depression. ? Hamilton Depression Rating Scale (HAM-D). This is a set of either 17 or 24 questions. You may be asked to take it again during or after your treatment, to see if your depression has gotten better. ? Beck Depression Inventory (BDI). This is a set of 21 multiple choice questions. Your health care provider scores your answers to assess:  Your level of depression, ranging from mild to severe.  Your response to treatment.  Your health care provider may talk with you about your daily activities, such as eating, sleeping, work, and recreation, and ask if you have had any changes in activity.  Your health care provider may ask you to see a mental health specialist, such as a psychiatrist or psychologist, for more  evaluation. Who should be screened for depression?  All adults, including adults with a family history of a mental health disorder.  Adolescents who are 89-77 years old.  People who are recovering from a myocardial infarction (MI).  Pregnant women, or women who have given birth.  People who have a long-term (chronic) illness.  Anyone who has been diagnosed with another type of a mental health disorder.  Anyone who has symptoms that could show depression.   What do my results mean? Your health care provider will review the results of your depression screening, physical exam, and lab tests. Positive screens suggest that you may have depression. Screening is the first step in getting the care that you may need. It is up to you to get your screening results. Ask your health care provider, or the department that is doing your screening tests, when your results will be ready. Talk with your health care provider about your results and diagnosis. A diagnosis of depression is made using the Diagnostic and Statistical Manual of Mental Disorders (DSM-V). This is a book that lists the number and type of symptoms that must be present for a health care provider to give a specific diagnosis.  Your health care provider may work with you to treat your symptoms of depression, or your health care provider may help you find a mental health provider who can assess, diagnose, and treat your depression. Get help right away if:  You have thoughts about hurting yourself or others. If you ever feel like you may hurt yourself or others, or have thoughts about taking your own life, get help  right away. You can go to your nearest emergency department or call:  Your local emergency services (911 in the [place]  A suicide crisis helpline, such as the [facility] at (779)432-9194. This is open 24 hours a day. Summary  Depression screening is the first step in getting the help that you may  need.  If your screening test shows symptoms of depression (is positive), your health care provider may ask you to see a mental health provider.  Anyone who is age 33 or older should be screened for depression. This information is not intended to replace advice given to you by your health care provider. Make sure you discuss any questions you have with your health care provider. Document Revised: 02/21/2020 Document Reviewed: 02/21/2020 Elsevier Patient Education  [employer].

## 2020-12-19 LAB — THYROID PANEL WITH TSH
Free Thyroxine Index: 1.6 (ref 1.4–3.8)
T3 Uptake: 27 % (ref 22–35)
T4, Total: 6.1 ug/dL (ref 5.1–11.9)
TSH: 4.44 mIU/L

## 2020-12-22 ENCOUNTER — Encounter: Payer: Self-pay | Admitting: *Deleted

## 2021-01-09 ENCOUNTER — Other Ambulatory Visit: Payer: Self-pay | Admitting: Family Medicine

## 2021-01-09 DIAGNOSIS — F418 Other specified anxiety disorders: Secondary | ICD-10-CM

## 2021-01-28 ENCOUNTER — Ambulatory Visit (AMBULATORY_SURGERY_CENTER): Payer: Self-pay

## 2021-01-28 ENCOUNTER — Other Ambulatory Visit: Payer: Self-pay

## 2021-01-28 VITALS — Ht 63.0 in | Wt 179.0 lb

## 2021-01-28 DIAGNOSIS — Z1211 Encounter for screening for malignant neoplasm of colon: Secondary | ICD-10-CM

## 2021-01-28 MED ORDER — SUTAB 1479-225-188 MG PO TABS: 12.0000 | ORAL_TABLET | ORAL | 0 refills | Status: DC

## 2021-01-28 NOTE — Progress Notes (Signed)
No allergies to soy or egg Pt is not on blood thinners or diet pills Denies issues with sedation/intubation Denies atrial flutter/fib Denies constipation   Emmi instructions given to pt  Pt is aware of Covid safety and care partner requirements.  

## 2021-02-04 ENCOUNTER — Encounter: Payer: Self-pay | Admitting: Gastroenterology

## 2021-02-11 ENCOUNTER — Ambulatory Visit (AMBULATORY_SURGERY_CENTER): Payer: 59 | Admitting: Gastroenterology

## 2021-02-11 ENCOUNTER — Encounter: Payer: Self-pay | Admitting: Gastroenterology

## 2021-02-11 ENCOUNTER — Other Ambulatory Visit: Payer: Self-pay

## 2021-02-11 VITALS — BP 100/70 | HR 73 | Temp 98.4°F | Resp 17 | Ht 63.0 in | Wt 174.0 lb

## 2021-02-11 DIAGNOSIS — D128 Benign neoplasm of rectum: Secondary | ICD-10-CM

## 2021-02-11 DIAGNOSIS — K621 Rectal polyp: Secondary | ICD-10-CM | POA: Diagnosis not present

## 2021-02-11 DIAGNOSIS — Z1211 Encounter for screening for malignant neoplasm of colon: Secondary | ICD-10-CM

## 2021-02-11 MED ORDER — SODIUM CHLORIDE 0.9 % IV SOLN: 500.0000 mL | Freq: Once | INTRAVENOUS | Status: DC

## 2021-02-11 NOTE — Progress Notes (Signed)
Called to room to assist during endoscopic procedure.  Patient ID and intended procedure confirmed with present staff. Received instructions for my participation in the procedure from the performing physician.  

## 2021-02-11 NOTE — Progress Notes (Signed)
VS by [place]  I have reviewed the patient's medical history in detail and updated the computerized patient record.  

## 2021-02-11 NOTE — Progress Notes (Signed)
PT taken to PACU. Monitors in place. VSS. Report given to RN. 

## 2021-02-11 NOTE — Op Note (Signed)
[facility] Patient Name: Stacey Hickman Procedure Date: 02/11/2021 1:30 PM MRN: [id number] Endoscopist: Mauri Pole , MD Age: 50 Referring MD:  Date of Birth: 12/10/1970 Gender: Female Account #: [id number] Procedure:                Colonoscopy Indications:              Screening for colorectal malignant neoplasm Medicines:                Monitored Anesthesia Care Procedure:                Pre-Anesthesia Assessment:                           - Prior to the procedure, a History and Physical                            was performed, and patient medications and                            allergies were reviewed. The patient's tolerance of                            previous anesthesia was also reviewed. The risks                            and benefits of the procedure and the sedation                            options and risks were discussed with the patient.                            All questions were answered, and informed consent                            was obtained. Prior Anticoagulants: The patient has                            taken no previous anticoagulant or antiplatelet                            agents. ASA Grade Assessment: II - A patient with                            mild systemic disease. After reviewing the risks                            and benefits, the patient was deemed in                            satisfactory condition to undergo the procedure.                           After obtaining informed consent, the colonoscope  was passed under direct vision. Throughout the                            procedure, the patient's blood pressure, pulse, and                            oxygen saturations were monitored continuously. The                            Olympus [id number]-[id number] (#[id number]) Colonoscope was                            introduced through the anus and advanced to the the                            cecum,  identified by appendiceal orifice and                            ileocecal valve. The colonoscopy was performed                            without difficulty. The patient tolerated the                            procedure well. The quality of the bowel                            preparation was excellent. The ileocecal valve,                            appendiceal orifice, and rectum were photographed. Scope In: 1:38:20 PM Scope Out: 1:56:34 PM Scope Withdrawal Time: 0 hours 11 minutes 8 seconds  Total Procedure Duration: 0 hours 18 minutes 14 seconds  Findings:                 The perianal and digital rectal examinations were                            normal.                           Three sessile polyps were found in the rectum. The                            polyps were 1 to 2 mm in size. These polyps were                            removed with a cold biopsy forceps. Resection and                            retrieval were complete.                           Scattered small-mouthed diverticula were found in  the sigmoid colon and descending colon.                           Non-bleeding internal hemorrhoids were found during                            retroflexion. The hemorrhoids were small. Complications:            No immediate complications. Estimated Blood Loss:     Estimated blood loss was minimal. Impression:               - Three 1 to 2 mm polyps in the rectum, removed                            with a cold biopsy forceps. Resected and retrieved.                           - Diverticulosis in the sigmoid colon and in the                            descending colon.                           - Non-bleeding internal hemorrhoids. Recommendation:           - Patient has a contact number available for                            emergencies. The signs and symptoms of potential                            delayed complications were discussed with the                             patient. Return to normal activities tomorrow.                            Written discharge instructions were provided to the                            patient.                           - Resume previous diet.                           - Continue present medications.                           - Await pathology results.                           - Repeat colonoscopy in 3 - 5 years for                            surveillance based on pathology results. Mauri Pole, MD 02/11/2021 2:05:04 PM This report  has been signed electronically.

## 2021-02-11 NOTE — Patient Instructions (Signed)
Handout on polyps and diverticulosis given    YOU HAD AN ENDOSCOPIC PROCEDURE TODAY AT THE [facility]:   Refer to the procedure report that was given to you for any specific questions about what was found during the examination.  If the procedure report does not answer your questions, please call your gastroenterologist to clarify.  If you requested that your care partner not be given the details of your procedure findings, then the procedure report has been included in a sealed envelope for you to review at your convenience later.  YOU SHOULD EXPECT: Some feelings of bloating in the abdomen. Passage of more gas than usual.  Walking can help get rid of the air that was put into your GI tract during the procedure and reduce the bloating. If you had a lower endoscopy (such as a colonoscopy or flexible sigmoidoscopy) you may notice spotting of blood in your stool or on the toilet paper. If you underwent a bowel prep for your procedure, you may not have a normal bowel movement for a few days.  Please Note:  You might notice some irritation and congestion in your nose or some drainage.  This is from the oxygen used during your procedure.  There is no need for concern and it should clear up in a day or so.  SYMPTOMS TO REPORT IMMEDIATELY:   Following lower endoscopy (colonoscopy or flexible sigmoidoscopy):  Excessive amounts of blood in the stool  Significant tenderness or worsening of abdominal pains  Swelling of the abdomen that is new, acute  Fever of 100F or higher   For urgent or emergent issues, a gastroenterologist can be reached at any hour by calling [phone]. Do not use MyChart messaging for urgent concerns.    DIET:  We do recommend a small meal at first, but then you may proceed to your regular diet.  Drink plenty of fluids but you should avoid alcoholic beverages for 24 hours.  ACTIVITY:  You should plan to take it easy for the rest of today and you should NOT  DRIVE or use heavy machinery until tomorrow (because of the sedation medicines used during the test).    FOLLOW UP: Our staff will call the number listed on your records 48-72 hours following your procedure to check on you and address any questions or concerns that you may have regarding the information given to you following your procedure. If we do not reach you, we will leave a message.  We will attempt to reach you two times.  During this call, we will ask if you have developed any symptoms of COVID 19. If you develop any symptoms (ie: fever, flu-like symptoms, shortness of breath, cough etc.) before then, please call [phone].  If you test positive for Covid 19 in the 2 weeks post procedure, please call and report this information to us.    If any biopsies were taken you will be contacted by phone or by letter within the next 1-3 weeks.  Please call us at [phone] if you have not heard about the biopsies in 3 weeks.    SIGNATURES/CONFIDENTIALITY: You and/or your care partner have signed paperwork which will be entered into your electronic medical record.  These signatures attest to the fact that that the information above on your After Visit Summary has been reviewed and is understood.  Full responsibility of the confidentiality of this discharge information lies with you and/or your care-partner. 

## 2021-02-13 ENCOUNTER — Telehealth: Payer: Self-pay | Admitting: *Deleted

## 2021-02-13 NOTE — Telephone Encounter (Signed)
Left message on f/u call 

## 2021-02-13 NOTE — Telephone Encounter (Signed)
  Follow up Call-  Call back number 02/11/2021  Post procedure Call Back phone  # [phone]  Permission to leave phone message Yes  Some recent data might be hidden     Patient questions:  Do you have a fever, pain , or abdominal swelling? No. Pain Score  0 *  Have you tolerated food without any problems? Yes.    Have you been able to return to your normal activities? Yes.    Do you have any questions about your discharge instructions: Diet   No. Medications  No. Follow up visit  No.  Do you have questions or concerns about your Care? No.  Actions: * If pain score is 4 or above: No action needed, pain <4.  1. Have you developed a fever since your procedure? no  2.   Have you had an respiratory symptoms (SOB or cough) since your procedure? no  3.   Have you tested positive for COVID 19 since your procedure no  4.   Have you had any family members/close contacts diagnosed with the COVID 19 since your procedure?  no   If yes to any of these questions please route to Joylene John, RN and Joella Prince, RN

## 2021-02-19 ENCOUNTER — Encounter: Payer: Self-pay | Admitting: Gastroenterology

## 2021-04-20 ENCOUNTER — Other Ambulatory Visit: Payer: Self-pay | Admitting: Family Medicine

## 2021-04-20 DIAGNOSIS — F418 Other specified anxiety disorders: Secondary | ICD-10-CM

## 2021-04-20 NOTE — Telephone Encounter (Signed)
Requesting: alprazolam 0.'25mg'$  Contract: None UDS: None Last Visit: 12/18/2020 Next Visit: None Last Refill: 11/03/2020 #20 and 0RF  Please Advise

## 2021-04-27 ENCOUNTER — Other Ambulatory Visit: Payer: Self-pay

## 2021-04-27 ENCOUNTER — Encounter: Payer: Self-pay | Admitting: Family Medicine

## 2021-04-27 ENCOUNTER — Ambulatory Visit: Payer: 59 | Admitting: Family Medicine

## 2021-04-27 VITALS — BP 110/88 | HR 94 | Temp 98.5°F | Resp 18 | Ht 63.0 in | Wt 183.4 lb

## 2021-04-27 DIAGNOSIS — B0239 Other herpes zoster eye disease: Secondary | ICD-10-CM

## 2021-04-27 DIAGNOSIS — G43809 Other migraine, not intractable, without status migrainosus: Secondary | ICD-10-CM

## 2021-04-27 MED ORDER — PREDNISONE 10 MG PO TABS: ORAL_TABLET | ORAL | 0 refills | Status: DC

## 2021-04-27 MED ORDER — GABAPENTIN 100 MG PO CAPS: 100.0000 mg | ORAL_CAPSULE | Freq: Three times a day (TID) | ORAL | 3 refills | Status: DC

## 2021-04-27 MED ORDER — SUMATRIPTAN SUCCINATE 100 MG PO TABS: 100.0000 mg | ORAL_TABLET | ORAL | 3 refills | Status: DC | PRN

## 2021-04-27 NOTE — Patient Instructions (Signed)
Shingles ?Shingles, which is also known as herpes zoster, is an infection that causes a painful skin rash and fluid-filled blisters. It is caused by a virus. ?Shingles only develops in people who: ?Have had chickenpox. ?Have been vaccinated against chickenpox. Shingles is rare in this group. ?What are the causes? ?Shingles is caused by varicella-zoster virus. This is the same virus that causes chickenpox. After a person is exposed to the virus, it stays in the body in an inactive (dormant) state. Shingles develops if the virus is reactivated. This can happen many years after the first (initial) exposure to the virus. It is not known what causes this virus to be reactivated. ?What increases the risk? ?People who have had chickenpox or received the chickenpox vaccine are at risk for shingles. Shingles infection is more common in people who: ?Are older than 50 years of age. ?Have a weakened disease-fighting system (immune system), such as people with: ?HIV (human immunodeficiency virus). ?AIDS (acquired immunodeficiency syndrome). ?Cancer. ?Are taking medicines that weaken the immune system, such as organ transplant medicines. ?Are experiencing a lot of stress. ?What are the signs or symptoms? ?Early symptoms of this condition include itching, tingling, and pain in an area on your skin. Pain may be described as burning, stabbing, or throbbing. ?A few days or weeks after early symptoms start, a painful red rash appears. The rash is usually on one side of the body and has a band-like or belt-like pattern. The rash eventually turns into fluid-filled blisters that break open, change into scabs, and dry up in about 2-3 weeks. ?At any time during the infection, you may also develop: ?A fever. ?Chills. ?A headache. ?Nausea. ?How is this diagnosed? ?This condition is diagnosed with a skin exam. Skin or fluid samples (a culture) may be taken from the blisters before a diagnosis is made. ?How is this treated? ?The rash may last  for several weeks. There is not a specific cure for this condition. Your health care provider may prescribe medicines to help you manage pain, recover more quickly, and avoid long-term problems. Medicines may include: ?Antiviral medicines. ?Anti-inflammatory medicines. ?Pain medicines. ?Anti-itching medicines (antihistamines). ?If the area involved is on your face, you may be referred to a specialist, such as an eye doctor (ophthalmologist) or an ear, nose, and throat (ENT) doctor (otorhinolaryngologist) to help you avoid eye problems, chronic pain, or disability. ?Follow these instructions at home: ?Medicines ?Take over-the-counter and prescription medicines only as told by your health care provider. ?Apply an anti-itch cream or numbing cream to the affected area as told by your health care provider. ?Relieving itching and discomfort ? ?Apply cold, wet cloths (cold compresses) to the area of the rash or blisters as told by your health care provider. ?Cool baths can be soothing. Try adding baking soda or dry oatmeal to the water to reduce itching. Do not bathe in hot water. ?Use calamine lotion as recommended by your health care provider. This is an over-the-counter lotion that helps to relieve itchiness. ?Blister and rash care ?Keep your rash covered with a loose bandage (dressing). Wear loose-fitting clothing to help ease the pain of material rubbing against the rash. ?Wash your hands with soap and water for at least 20 seconds before and after you change your dressing. If soap and water are not available, use hand sanitizer. ?Change your dressing as told by your health care provider. ?Keep your rash and blisters clean by washing the area with mild soap and cool water as told by your health   care provider. ?Check your rash every day for signs of infection. Check for: ?More redness, swelling, or pain. ?Fluid or blood. ?Warmth. ?Pus or a bad smell. ?Do not scratch your rash or pick at your blisters. To help avoid  scratching: ?Keep your fingernails clean and cut short. ?Wear gloves or mittens while you sleep, if scratching is a problem. ?General instructions ?Rest as told by your health care provider. ?Wash your hands often with soap and water for at least 20 seconds. If soap and water are not available, use hand sanitizer. Doing this lowers your chance of getting a bacterial skin infection. ?Before your blisters change into scabs, your shingles infection can cause chickenpox in people who have never had it or have never been vaccinated against it. To prevent this from happening, avoid contact with other people, especially: ?Babies. ?Pregnant women. ?Children who have eczema. ?Older people who have transplants. ?People who have chronic illnesses, such as cancer or AIDS. ?Keep all follow-up visits. This is important. ?How is this prevented? ?Getting vaccinated is the best way to prevent shingles and protect against shingles complications. If you have not been vaccinated, talk with your health care provider about getting the vaccine. ?Where to find more information ?Centers for Disease Control and Prevention: www.cdc.gov ?Contact a health care provider if: ?Your pain is not relieved with prescribed medicines. ?Your pain does not get better after the rash heals. ?You have any of these signs of infection: ?More redness, swelling, or pain around the rash. ?Fluid or blood coming from the rash. ?Warmth coming from your rash. ?Pus or a bad smell coming from the rash. ?A fever. ?Get help right away if: ?The rash is on your face or nose. ?You have facial pain, pain around your eye area, or loss of feeling on one side of your face. ?You have difficulty seeing. ?You have ear pain or have ringing in your ear. ?You have a loss of taste. ?Your condition gets worse. ?Summary ?Shingles, also known as herpes zoster, is an infection that causes a painful skin rash and fluid-filled blisters. ?This condition is diagnosed with a skin exam. Skin or  fluid samples (a culture) may be taken from the blisters. ?Keep your rash covered with a loose bandage (dressing). Wear loose-fitting clothing to help ease the pain of material rubbing against the rash. ?Before your blisters change into scabs, your shingles infection can cause chickenpox in people who have never had it or have never been vaccinated against it. ?This information is not intended to replace advice given to you by your health care provider. Make sure you discuss any questions you have with your health care provider. ?Document Revised: 08/25/2020 Document Reviewed: 08/25/2020 ?Elsevier Patient Education ? 2022 [employer] Inc[employer] ? ?

## 2021-04-27 NOTE — Assessment & Plan Note (Signed)
Finish famvir pred taper  neurontin 100 mg --- start with 1 po qhs ---- can inc to tid if needed  Pt to see opth tomorrow

## 2021-04-27 NOTE — Progress Notes (Signed)
Established Patient Office Visit  Subjective:  Patient ID: Stacey Hickman, female    DOB: [birth year]  Age: 50 y.o. MRN: [id number]  CC:  Chief Complaint  Patient presents with   Herpes Zoster    Pt having e visit on Saturday and was Dx with Shingles. Shingles is on face and right eye.     HPI Stacey Hickman presents for shingles R side of forehead and eye.  She was started on famvir on Saturday.   The rash is now getting very close to the R eye.    Past Medical History:  Diagnosis Date   Depression    no problems   Dizziness    Gestational hypertension 05/27/2014   Headache(784.0)    Insomnia    Left knee injury 03/2011   was in immobilizer   Tendonitis of foot 11/2010   Right foot    Past Surgical History:  Procedure Laterality Date   IUD REMOVAL     OTHER SURGICAL HISTORY     laproscopic removal of IUD, perfed uterus   WISDOM TOOTH EXTRACTION      Family History  Problem Relation Age of Onset   Obesity Mother    Diabetes Mother        Prediabetic   Hypothyroidism Mother    Obesity Sister    Diabetes Brother    Obesity Brother    Diabetes Brother    Hypertension Brother    Hyperlipidemia Brother    Hypothyroidism Brother    Diabetes Father    Obesity Father    Diabetes Paternal Grandfather    Colon polyps Neg Hx    Colon cancer Neg Hx    Esophageal cancer Neg Hx    Stomach cancer Neg Hx    Rectal cancer Neg Hx     Social History   Socioeconomic History   Marital status: Married    Spouse name: Not on file   Number of children: 3   Years of education: Not on file   Highest education level: Not on file  Occupational History   Occupation: PHARMACIST    Comment: [employer]  Tobacco Use   Smoking status: Never   Smokeless tobacco: Never  Vaping Use   Vaping Use: Never used  Substance and Sexual Activity   Alcohol use: Yes    Comment: occ, not with preg   Drug use: No   Sexual activity: Yes    Partners: Male  Other Topics Concern   Not on file   Social History Narrative   Exercising--  30 min 2x a week--- walking   Social Determinants of Health   Financial Resource Strain: Not on file  Food Insecurity: Not on file  Transportation Needs: Not on file  Physical Activity: Not on file  Stress: Not on file  Social Connections: Not on file  Intimate Partner Violence: Not on file    Outpatient Medications Prior to Visit  Medication Sig Dispense Refill   ALPRAZolam (XANAX) 0.25 MG tablet TAKE 1 TABLET BY MOUTH 2 TIMES DAILY AS NEEDED FOR ANXIETY. 20 tablet 0   ARIPiprazole (ABILIFY) 5 MG tablet TAKE 1 TABLET (5 MG TOTAL) BY MOUTH DAILY. 90 tablet 1   buPROPion (WELLBUTRIN XL) 150 MG 24 hr tablet TAKE 1 TABLET BY MOUTH EVERY DAY 90 tablet 1   desvenlafaxine (PRISTIQ) 100 MG 24 hr tablet Take 1 tablet (100 mg total) by mouth daily. 90 tablet 3   fluticasone (FLONASE) 50 MCG/ACT nasal spray Place 2 sprays  into both nostrils daily. 16 g 6   ibuprofen (ADVIL,MOTRIN) 600 MG tablet Take 1 tablet (600 mg total) by mouth every 6 (six) hours. 30 tablet 0   levonorgestrel (MIRENA) 20 MCG/24HR IUD 1 each by Intrauterine route once.     spironolactone (ALDACTONE) 50 MG tablet Take 50 mg by mouth daily.     topiramate (TOPAMAX) 50 MG tablet TAKE 2 TABLETS (100 MG TOTAL) BY MOUTH 2 (TWO) TIMES DAILY. 360 tablet 1   SUMAtriptan (IMITREX) 100 MG tablet Take 1 tablet (100 mg total) by mouth every 2 (two) hours as needed for migraine. Max 2 per 24hrs 10 tablet 3   famciclovir (FAMVIR) 500 MG tablet Take 500 mg by mouth 3 (three) times daily.     azithromycin (ZITHROMAX Z-PAK) 250 MG tablet As directed (Patient not taking: No sig reported) 6 each 0   No facility-administered medications prior to visit.    No Known Allergies  ROS Review of Systems  Constitutional:  Negative for appetite change, diaphoresis, fatigue and unexpected weight change.  Eyes:  Negative for pain, redness and visual disturbance.  Respiratory:  Negative for cough, chest  tightness, shortness of breath and wheezing.   Cardiovascular:  Negative for chest pain, palpitations and leg swelling.  Endocrine: Negative for cold intolerance, heat intolerance, polydipsia, polyphagia and polyuria.  Genitourinary:  Negative for difficulty urinating, dysuria and frequency.  Skin:  Positive for rash.  Neurological:  Negative for dizziness, light-headedness, numbness and headaches.     Objective:    Physical Exam Vitals and nursing note reviewed.  Constitutional:      Appearance: She is well-developed.  HENT:     Head: Normocephalic and atraumatic.  Eyes:     Conjunctiva/sclera: Conjunctivae normal.  Neck:     Thyroid: No thyromegaly.     Vascular: No carotid bruit or JVD.  Cardiovascular:     Rate and Rhythm: Normal rate and regular rhythm.     Heart sounds: Normal heart sounds. No murmur heard. Pulmonary:     Effort: Pulmonary effort is normal. No respiratory distress.     Breath sounds: Normal breath sounds. No wheezing or rales.  Chest:     Chest wall: No tenderness.  Musculoskeletal:     Cervical back: Normal range of motion and neck supple.  Skin:    Findings: Rash present. Rash is vesicular.     Comments: Vesicular rash R side forehead and R eye lid   Neurological:     Mental Status: She is alert and oriented to person, place, and time.  Psychiatric:        Mood and Affect: Mood normal.        Behavior: Behavior normal.        Thought Content: Thought content normal.        Judgment: Judgment normal.   BP 110/88 (BP Location: Left Arm, Patient Position: Sitting, Cuff Size: Normal)   Pulse 94   Temp 98.5 F (36.9 C) (Oral)   Resp 18   Ht '5\' 3"'$  (1.6 m)   Wt 183 lb 6.4 oz (83.2 kg)   SpO2 98%   BMI 32.49 kg/m  Wt Readings from Last 3 Encounters:  04/27/21 183 lb 6.4 oz (83.2 kg)  02/11/21 174 lb (78.9 kg)  01/28/21 179 lb (81.2 kg)     Health Maintenance Due  Topic Date Due   MAMMOGRAM  03/12/2014   Zoster Vaccines- Shingrix (1 of 2)  Never done   PAP SMEAR-Modifier  01/13/2021  INFLUENZA VACCINE  04/13/2021    There are no preventive care reminders to display for this patient.  Lab Results  Component Value Date   TSH 4.44 12/18/2020   Lab Results  Component Value Date   WBC 6.2 11/20/2020   HGB 15.8 (H) 11/20/2020   HCT 47.1 (H) 11/20/2020   MCV 91.7 11/20/2020   PLT 167.0 11/20/2020   Lab Results  Component Value Date   NA 141 11/20/2020   K 4.1 11/20/2020   CO2 28 11/20/2020   GLUCOSE 89 11/20/2020   BUN 17 11/20/2020   CREATININE 1.11 11/20/2020   BILITOT 0.6 11/20/2020   ALKPHOS 47 11/20/2020   AST 22 11/20/2020   ALT 29 11/20/2020   PROT 7.5 11/20/2020   ALBUMIN 4.4 11/20/2020   CALCIUM 9.6 11/20/2020   ANIONGAP 14 05/27/2014   GFR 58.18 (L) 11/20/2020   Lab Results  Component Value Date   CHOL 167 11/20/2020   Lab Results  Component Value Date   HDL 40.60 11/20/2020   Lab Results  Component Value Date   LDLCALC 97 11/20/2020   Lab Results  Component Value Date   TRIG 146.0 11/20/2020   Lab Results  Component Value Date   CHOLHDL 4 11/20/2020   Lab Results  Component Value Date   HGBA1C 4.9 11/20/2020      Assessment & Plan:   Problem List Items Addressed This Visit       Unprioritized   Migraine   Relevant Medications   SUMAtriptan (IMITREX) 100 MG tablet   gabapentin (NEURONTIN) 100 MG capsule   Shingles of eyelid - Primary    Finish famvir pred taper  neurontin 100 mg --- start with 1 po qhs ---- can inc to tid if needed  Pt to see opth tomorrow       Relevant Medications   famciclovir (FAMVIR) 500 MG tablet   predniSONE (DELTASONE) 10 MG tablet   gabapentin (NEURONTIN) 100 MG capsule   Other Relevant Orders   Ambulatory referral to Ophthalmology    Meds ordered this encounter  Medications   predniSONE (DELTASONE) 10 MG tablet    Sig: TAKE 3 TABLETS PO QD FOR 3 DAYS THEN TAKE 2 TABLETS PO QD FOR 3 DAYS THEN TAKE 1 TABLET PO QD FOR 3 DAYS THEN TAKE  1/2 TAB PO QD FOR 3 DAYS    Dispense:  20 tablet    Refill:  0   SUMAtriptan (IMITREX) 100 MG tablet    Sig: Take 1 tablet (100 mg total) by mouth every 2 (two) hours as needed for migraine. Max 2 per 24hrs    Dispense:  10 tablet    Refill:  3   gabapentin (NEURONTIN) 100 MG capsule    Sig: Take 1 capsule (100 mg total) by mouth 3 (three) times daily.    Dispense:  90 capsule    Refill:  3    Follow-up: Return if symptoms worsen or fail to improve.    [name], DO

## 2021-04-29 ENCOUNTER — Encounter (INDEPENDENT_AMBULATORY_CARE_PROVIDER_SITE_OTHER): Payer: Self-pay | Admitting: Ophthalmology

## 2021-04-30 ENCOUNTER — Encounter (INDEPENDENT_AMBULATORY_CARE_PROVIDER_SITE_OTHER): Payer: Self-pay | Admitting: Ophthalmology

## 2021-04-30 ENCOUNTER — Encounter (INDEPENDENT_AMBULATORY_CARE_PROVIDER_SITE_OTHER): Payer: 59 | Admitting: Ophthalmology

## 2021-04-30 ENCOUNTER — Other Ambulatory Visit: Payer: Self-pay

## 2021-04-30 DIAGNOSIS — H33303 Unspecified retinal break, bilateral: Secondary | ICD-10-CM

## 2021-04-30 DIAGNOSIS — H43813 Vitreous degeneration, bilateral: Secondary | ICD-10-CM

## 2021-05-01 ENCOUNTER — Encounter (INDEPENDENT_AMBULATORY_CARE_PROVIDER_SITE_OTHER): Payer: 59 | Admitting: Ophthalmology

## 2021-05-01 DIAGNOSIS — H33301 Unspecified retinal break, right eye: Secondary | ICD-10-CM

## 2021-05-05 ENCOUNTER — Ambulatory Visit: Payer: 59 | Attending: Orthopedic Surgery | Admitting: Physical Therapy

## 2021-05-05 ENCOUNTER — Other Ambulatory Visit: Payer: Self-pay

## 2021-05-05 ENCOUNTER — Encounter: Payer: Self-pay | Admitting: Physical Therapy

## 2021-05-05 DIAGNOSIS — R262 Difficulty in walking, not elsewhere classified: Secondary | ICD-10-CM | POA: Insufficient documentation

## 2021-05-05 DIAGNOSIS — M25562 Pain in left knee: Secondary | ICD-10-CM

## 2021-05-05 NOTE — Patient Instructions (Signed)
Access Code: [email or web address] URL: [email or web address] Date: 05/05/2021 Prepared by: Lum Babe  Exercises Standing Gastroc Stretch on Foam 1/2 Roll - 2 x daily - 7 x weekly - 1 sets - 5 reps - 30 hold Seated Hamstring Stretch - 2 x daily - 7 x weekly - 1 sets - 5 reps - 30 hold Seated Hamstring Stretch with Chair - 2 x daily - 7 x weekly - 1 sets - 5 reps - 30 hold Supine ITB Stretch with Strap - 2 x daily - 7 x weekly - 1 sets - 5 reps - 30 hold Supine Short Arc Quad - 2 x daily - 7 x weekly - 2 sets - 10 reps - 3 hold

## 2021-05-05 NOTE — Therapy (Signed)
[place] [place], [place], [address] Phone: [phone]   Fax:  [phone]  Physical Therapy Evaluation  Patient Details  Name: Stacey Hickman MRN: [id number] Date of Birth: 1971/06/02 Referring Provider (PT): Aluisio   Encounter Date: 05/05/2021   PT End of Session - 05/05/21 1044     Visit Number 1    Number of Visits 23    Authorization Type U[employer]    PT Start Time [id number]    PT Stop Time 1055    PT Time Calculation (min) 41 min    Activity Tolerance Patient tolerated treatment well    Behavior During Therapy [facility] for tasks assessed/performed             Past Medical History:  Diagnosis Date   Depression    no problems   Dizziness    Gestational hypertension 05/27/2014   Headache(784.0)    Insomnia    Left knee injury 03/2011   was in immobilizer   Tendonitis of foot 11/2010   Right foot    Past Surgical History:  Procedure Laterality Date   IUD REMOVAL     OTHER SURGICAL HISTORY     laproscopic removal of IUD, perfed uterus   WISDOM TOOTH EXTRACTION      There were no vitals filed for this visit.    Subjective Assessment - 05/05/21 1017     Subjective Patient reports left knee pain starting in March, unsure of why, she does report a patellar subluxation about 10-12 years ago.  Recent MRI showed good cartilage and no arthritis, MD feels it is patellofemoral issue    Limitations Lifting;Standing;Walking;House hold activities;Sitting    How long can you walk comfortably? less than 15 minutes due to pain    Patient Stated Goals have less pain, would like to return to exercise    Currently in Pain? Yes    Pain Score 0-No pain    Pain Location Knee    Pain Orientation Left;Anterior    Pain Descriptors / Indicators Aching;Sharp    Pain Type Acute pain    Pain Onset More than a month ago    Pain Frequency Constant    Aggravating Factors  7/10 without meds, stairs, walking, squatting, sitting     Pain Relieving Factors prednisone has taken pain to 0/10, she has been on prednisone 8 days, she used ibuprofen and diclofenac get prior that helped    Effect of Pain on Daily Activities difficulty with all ADL"s                [facility] PT Assessment - 05/05/21 0001       Assessment   Medical Diagnosis left knee pain    Referring Provider (PT) Aluisio    Onset Date/Surgical Date 04/04/21    Prior Therapy no      Precautions   Precautions None      Balance Screen   Has the patient fallen in the past 6 months No    Has the patient had a decrease in activity level because of a fear of falling?  No    Is the patient reluctant to leave their home because of a fear of falling?  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, mostly sitting  Leisure walking 30 minutes 3x/week      ROM / Strength   AROM / PROM / Strength AROM;PROM;Strength      AROM   Overall AROM Comments AROM of the left knee is WFL's      Strength   Overall Strength Comments 4+/5      Flexibility   Soft Tissue Assessment /Muscle Length yes    Hamstrings tight, tight calves as well with pain    ITB tight with pain      Palpation   Palpation comment slight lateral tracking, crepitus patella area with stairs up and down      Ambulation/Gait   Gait Comments no limp, pain with down stairs has some crepitus in the patella with ascending and descending                        Objective measurements completed on examination: See above findings.                 PT Short Term Goals - 05/05/21 1048       PT SHORT TERM GOAL #1   Title independent with initial HEP    Time 2    Period Weeks    Status New               PT Long Term Goals - 05/05/21 1048       PT LONG TERM GOAL #1   Title report overall pain relief  50% without prednisome    Time 8    Status New      PT LONG TERM GOAL #2   Title go up and down stairs step over step wihtout pain    Time 8    Period Weeks    Status New      PT LONG TERM GOAL #3   Title indepednent advanced HEP    Time 8    Period Weeks    Status New      PT LONG TERM GOAL #4   Title resume walking 30 minutes 2-3x/week wihtout increase of pain    Time 8    Period Weeks    Status New                    Plan - 05/05/21 1045     Clinical Impression Statement Patient reports that she started having left knee pain in March, unsure of a cause, did have an injury of patellar subluxation about 10-12 years ago.  She reports that she was having constant pain in the patellar tendon area, until she started prednisone, now having very minimal pain, she reports that due to the pain she is unable to walk for exercise, her ROM is WFL's, strength 4+/5.  She is very tight in the calves and HS, the HS tightness replicated her pain, very tight ITB with pain.  Slight lateral tracking patella, mild crepitus with stairs.    Stability/Clinical Decision Making Stable/Uncomplicated    Clinical Decision Making Low    Rehab Potential Good    PT Frequency 1x / week    PT Duration 8 weeks    PT Treatment/Interventions ADLs/Self Care Home Management;Cryotherapy;Electrical Stimulation;Iontophoresis '4mg'$ /ml Dexamethasone;Ultrasound;Gait training;Neuromuscular re-education;Balance training;Therapeutic exercise;Therapeutic activities;Functional mobility training;Stair training;Patient/family education;Dry needling    PT Next Visit Plan MD order is 2x/week for 8 weeks, she has a $40 co pay so she opted to try to do with less frequency, her prednisone ends this week.  Consulted and Agree with Plan of Care Patient             Patient will benefit from skilled therapeutic intervention in order to improve the following deficits and impairments:  Difficulty walking, Decreased activity  tolerance, Pain, Impaired flexibility, Decreased strength, Decreased mobility  Visit Diagnosis: Acute pain of left knee - Plan: PT plan of care cert/re-cert  Difficulty in walking, not elsewhere classified - Plan: PT plan of care cert/re-cert     Problem List Patient Active Problem List   Diagnosis Date Noted   Shingles of eyelid 04/27/2021   Depression with anxiety 05/25/2020   Right hand pain 05/31/2016   Wrist injury 10/06/2015   Hyperlipidemia 03/03/2015   Dizzy 03/03/2015   Thrombocytopenia, unspecified ([place]) 05/29/2014   Hx of preterm delivery, currently pregnant 05/27/2014   Gestational hypertension 05/27/2014   Vaginal delivery 05/27/2014   Trisomy 21, child of prior pregnancy, currently pregnant 10/08/2013   Elderly multigravida with antepartum condition or complication [id number]   Rh negative state in antepartum period 10/08/2013   Obesity (BMI 30.0-34.9) 05/28/2013   Migraine 05/28/2013   Depression, major, single episode, moderate ([place]) 08/21/2008    Sumner Boast., PT 05/05/2021, 10:51 AM  [place]. [place], [place], [address] Phone: [phone]   Fax:  [phone]  Name: Stacey Hickman MRN: [id number] Date of Birth: [birth year]

## 2021-05-11 ENCOUNTER — Other Ambulatory Visit: Payer: Self-pay

## 2021-05-11 ENCOUNTER — Encounter (INDEPENDENT_AMBULATORY_CARE_PROVIDER_SITE_OTHER): Payer: 59 | Admitting: Ophthalmology

## 2021-05-11 DIAGNOSIS — H33301 Unspecified retinal break, right eye: Secondary | ICD-10-CM

## 2021-05-12 ENCOUNTER — Ambulatory Visit: Payer: 59 | Admitting: Physical Therapy

## 2021-05-12 DIAGNOSIS — M25562 Pain in left knee: Secondary | ICD-10-CM

## 2021-05-12 DIAGNOSIS — R262 Difficulty in walking, not elsewhere classified: Secondary | ICD-10-CM

## 2021-05-12 NOTE — Therapy (Signed)
[place]. [place], [place], [address] Phone: [phone]   Fax:  [phone]  Physical Therapy Treatment  Patient Details  Name: Stacey Hickman MRN: [id number] Date of Birth: [birth year] Referring Provider (PT): Aluisio   Encounter Date: 05/12/2021   PT End of Session - 05/12/21 0959     Visit Number 2    Number of Visits 23    Authorization Type UHC    PT Start Time 0930    PT Stop Time 2202    PT Time Calculation (min) 45 min             Past Medical History:  Diagnosis Date   Depression    no problems   Dizziness    Gestational hypertension 05/27/2014   Headache(784.0)    Insomnia    Left knee injury 03/2011   was in immobilizer   Tendonitis of foot 11/2010   Right foot    Past Surgical History:  Procedure Laterality Date   IUD REMOVAL     OTHER SURGICAL HISTORY     laproscopic removal of IUD, perfed uterus   WISDOM TOOTH EXTRACTION      There were no vitals filed for this visit.   Subjective Assessment - 05/12/21 0928     Subjective stopped prednisone and pain returns. morning are okay but pain increases as day goes on    Currently in Pain? No/denies                               [facility] Adult PT Treatment/Exercise - 05/12/21 0001       Exercises   Exercises Knee/Hip      Knee/Hip Exercises: Aerobic   Nustep L 5 5 min      Knee/Hip Exercises: Machines for Strengthening   Cybex Knee Extension 5# 2 sets 10   up with 2 down with left eccentric   Cybex Knee Flexion 15# 2 sets 10 LLE    Cybex Leg Press 30# 3 sets 10 feet 3 ways      Knee/Hip Exercises: Standing   Knee Flexion Strengthening;Left;2 sets;10 reps   LE in ER   Knee Flexion Limitations 3#    Lateral Step Up 10 reps;Hand Hold: 1;Step Height: 6";Left   VMO/ADD step up   Forward Step Up 10 reps;Hand Hold: 2;Step Height: 6";Left    Step Down 10 reps;Hand Hold: 2;Step Height: 4";Left      Knee/Hip  Exercises: Seated   Long Arc Quad Strengthening;Right;2 sets;10 reps   VMO ball squeeze   Long Arc Quad Weight 3 lbs.      Modalities   Modalities Iontophoresis      Iontophoresis   Type of Iontophoresis Dexamethasone    Location Left Lat knee    Dose 1.2    Time 4 hour patch                      PT Short Term Goals - 05/12/21 0959       PT SHORT TERM GOAL #1   Title independent with initial HEP    Status Achieved               PT Long Term Goals - 05/05/21 1048       PT LONG TERM GOAL #1   Title report overall pain relief 50% without prednisome    Time 8    Status  New      PT LONG TERM GOAL #2   Title go up and down stairs step over step wihtout pain    Time 8    Period Weeks    Status New      PT LONG TERM GOAL #3   Title indepednent advanced HEP    Time 8    Period Weeks    Status New      PT LONG TERM GOAL #4   Title resume walking 30 minutes 2-3x/week wihtout increase of pain    Time 8    Period Weeks    Status New                   Plan - 05/12/21 1610     Clinical Impression Statement STG met. Verb doing HEP inconsistantly. Tolerated initialprogression of ther ex well with targeting VMO with cuing for speed and control of mvmt.Dillard Cannon for pain.    PT Treatment/Interventions ADLs/Self Care Home Management;Cryotherapy;Electrical Stimulation;Iontophoresis 63m/ml Dexamethasone;Ultrasound;Gait training;Neuromuscular re-education;Balance training;Therapeutic exercise;Therapeutic activities;Functional mobility training;Stair training;Patient/family education;Dry needling    PT Next Visit Plan assess and progress             Patient will benefit from skilled therapeutic intervention in order to improve the following deficits and impairments:  Difficulty walking, Decreased activity tolerance, Pain, Impaired flexibility, Decreased strength, Decreased mobility  Visit Diagnosis: Acute pain of left knee  Difficulty in walking,  not elsewhere classified     Problem List Patient Active Problem List   Diagnosis Date Noted   Shingles of eyelid 04/27/2021   Depression with anxiety 05/25/2020   Right hand pain 05/31/2016   Wrist injury 10/06/2015   Hyperlipidemia 03/03/2015   Dizzy 03/03/2015   Thrombocytopenia, unspecified ([place] 05/29/2014   Hx of preterm delivery, currently pregnant 05/27/2014   Gestational hypertension 05/27/2014   Vaginal delivery 05/27/2014   Trisomy 21, child of prior pregnancy, currently pregnant 10/08/2013   Elderly multigravida with antepartum condition or complication 096/12/5407  Rh negative state in antepartum period 10/08/2013   Obesity (BMI 30.0-34.9) 05/28/2013   Migraine 05/28/2013   Depression, major, single episode, moderate ([place] 08/21/2008    Sunita Demond,ANGIE PTA 05/12/2021, 10:01 AM  [place] [address]Phone: [phone]  Fax:  [phone] Name: Stacey WESTERHOFFMRN: [id number] of Birth: 312/18/1972

## 2021-05-21 ENCOUNTER — Other Ambulatory Visit: Payer: Self-pay

## 2021-05-21 ENCOUNTER — Ambulatory Visit: Payer: 59 | Attending: Orthopedic Surgery | Admitting: Rehabilitative and Restorative Service Providers"

## 2021-05-21 ENCOUNTER — Encounter: Payer: Self-pay | Admitting: Rehabilitative and Restorative Service Providers"

## 2021-05-21 DIAGNOSIS — R262 Difficulty in walking, not elsewhere classified: Secondary | ICD-10-CM | POA: Diagnosis present

## 2021-05-21 DIAGNOSIS — M25562 Pain in left knee: Secondary | ICD-10-CM | POA: Insufficient documentation

## 2021-05-21 NOTE — Therapy (Signed)
[place]. [place], [place], [address] Phone: [phone]   Fax:  [phone]  Physical Therapy Treatment  Patient Details  Name: CHRISTOL RAZVI MRN: [id number] Date of Birth: 1971-03-17 Referring Provider (PT): Aluisio   Encounter Date: 05/21/2021   PT End of Session - 05/21/21 1001     Visit Number 3    Number of Visits 23    Authorization Type UHC    PT Start Time 0930    PT Stop Time 1010    PT Time Calculation (min) 40 min    Activity Tolerance Patient tolerated treatment well    Behavior During Therapy [facility] for tasks assessed/performed             Past Medical History:  Diagnosis Date   Depression    no problems   Dizziness    Gestational hypertension 05/27/2014   Headache(784.0)    Insomnia    Left knee injury 03/2011   was in immobilizer   Tendonitis of foot 11/2010   Right foot    Past Surgical History:  Procedure Laterality Date   IUD REMOVAL     OTHER SURGICAL HISTORY     laproscopic removal of IUD, perfed uterus   WISDOM TOOTH EXTRACTION      There were no vitals filed for this visit.   Subjective Assessment - 05/21/21 0933     Subjective My pain has "been pretty good".  Pt states that the ionto patch helped.    Currently in Pain? Yes    Pain Score 3     Pain Location Knee    Pain Orientation Left;Anterior                               [facility] Adult PT Treatment/Exercise - 05/21/21 0001       Knee/Hip Exercises: Aerobic   Nustep L 5 5 min      Knee/Hip Exercises: Machines for Strengthening   Cybex Knee Extension 5# 2 sets 10   up with 2 down with left eccentric   Cybex Knee Flexion 25# 2x10, 15# x12 LLE only    Cybex Leg Press 30# 3 sets 10 feet 3 ways      Knee/Hip Exercises: Standing   Knee Flexion Strengthening;Left;2 sets;10 reps   LE in ER   Knee Flexion Limitations 3#    Forward Lunges Both;2 sets   front leg onto Bosu   Lateral Step Up 10  reps;Hand Hold: 1;Step Height: 6";Left   VMO/add   Forward Step Up 10 reps;Hand Hold: 2;Step Height: 6";Left    Step Down 10 reps;Hand Hold: 2;Step Height: 4";Left    Walking with Sports Cord 4 way 20#, 4 each      Knee/Hip Exercises: Seated   Long Arc Quad Strengthening;Right;2 sets;10 reps   VMO with ball squeeze   Long Arc Quad Weight 3 lbs.      Modalities   Modalities Iontophoresis      Iontophoresis   Type of Iontophoresis Dexamethasone    Location Left Lat knee    Dose 1.2    Time 4 hour patch                       PT Short Term Goals - 05/12/21 0959       PT SHORT TERM GOAL #1   Title independent with initial HEP    Status  Achieved               PT Long Term Goals - 05/21/21 1059       PT LONG TERM GOAL #1   Title report overall pain relief 50% without prednisome    Status On-going      PT LONG TERM GOAL #2   Title go up and down stairs step over step wihtout pain    Status On-going      PT LONG TERM GOAL #3   Title indepednent advanced HEP    Status On-going      PT LONG TERM GOAL #4   Title resume walking 30 minutes 2-3x/week wihtout increase of pain    Status On-going                   Plan - 05/21/21 1001     Clinical Impression Statement Ms Prusha continues to progress towards goal related activities.  She continues to do well with VMO targeted exercises.  She had some difficulty with lunging onto bosu and with side stepping with red theraband.  She requested ionto again, as she had good relief with it last time.  She requied some cuing during ther ex to slow pace to enhance eccentric muscle control.    PT Treatment/Interventions ADLs/Self Care Home Management;Cryotherapy;Electrical Stimulation;Iontophoresis '4mg'$ /ml Dexamethasone;Ultrasound;Gait training;Neuromuscular re-education;Balance training;Therapeutic exercise;Therapeutic activities;Functional mobility training;Stair training;Patient/family education;Dry needling     PT Next Visit Plan assess and progress strengthening and stability    Consulted and Agree with Plan of Care Patient             Patient will benefit from skilled therapeutic intervention in order to improve the following deficits and impairments:  Difficulty walking, Decreased activity tolerance, Pain, Impaired flexibility, Decreased strength, Decreased mobility  Visit Diagnosis: Acute pain of left knee  Difficulty in walking, not elsewhere classified     Problem List Patient Active Problem List   Diagnosis Date Noted   Shingles of eyelid 04/27/2021   Depression with anxiety 05/25/2020   Right hand pain 05/31/2016   Wrist injury 10/06/2015   Hyperlipidemia 03/03/2015   Dizzy 03/03/2015   Thrombocytopenia, unspecified ([place]) 05/29/2014   Hx of preterm delivery, currently pregnant 05/27/2014   Gestational hypertension 05/27/2014   Vaginal delivery 05/27/2014   Trisomy 21, child of prior pregnancy, currently pregnant 10/08/2013   Elderly multigravida with antepartum condition or complication [id number]   Rh negative state in antepartum period 10/08/2013   Obesity (BMI 30.0-34.9) 05/28/2013   Migraine 05/28/2013   Depression, major, single episode, moderate ([place]) 08/21/2008    Shelby Dubin Mishal Probert, PT 05/21/2021, 11:00 AM  [place]. [place], [place], [address] Phone: [phone]   Fax:  [phone]  Name: SHANADE MATTIA MRN: [id number] Date of Birth: [birth year]

## 2021-05-26 ENCOUNTER — Ambulatory Visit: Payer: 59 | Admitting: Physical Therapy

## 2021-05-26 ENCOUNTER — Other Ambulatory Visit: Payer: Self-pay

## 2021-05-26 DIAGNOSIS — M25562 Pain in left knee: Secondary | ICD-10-CM

## 2021-05-26 DIAGNOSIS — R262 Difficulty in walking, not elsewhere classified: Secondary | ICD-10-CM

## 2021-05-26 NOTE — Therapy (Signed)
[place]. [place], [place], [address] Phone: [phone]   Fax:  [phone]  Physical Therapy Treatment  Patient Details  Name: Stacey Hickman MRN: [id number] Date of Birth: 1970/10/01 Referring Provider (PT): Aluisio   Encounter Date: 05/26/2021   PT End of Session - 05/26/21 1051     Visit Number 4    Number of Visits 23    Authorization Type UHC    PT Start Time [phone]    PT Stop Time 1013    PT Time Calculation (min) 44 min    Activity Tolerance Patient tolerated treatment well    Behavior During Therapy [facility] for tasks assessed/performed             Past Medical History:  Diagnosis Date   Depression    no problems   Dizziness    Gestational hypertension 05/27/2014   Headache(784.0)    Insomnia    Left knee injury 03/2011   was in immobilizer   Tendonitis of foot 11/2010   Right foot    Past Surgical History:  Procedure Laterality Date   IUD REMOVAL     OTHER SURGICAL HISTORY     laproscopic removal of IUD, perfed uterus   WISDOM TOOTH EXTRACTION      There were no vitals filed for this visit.   Subjective Assessment - 05/26/21 0932     Subjective i think I am doing better    Currently in Pain? No/denies                               [facility] Adult PT Treatment/Exercise - 05/26/21 0001       Knee/Hip Exercises: Stretches   Passive Hamstring Stretch Left;4 reps;20 seconds    Quad Stretch Left;3 reps;20 seconds    ITB Stretch Left;3 reps;20 seconds    Piriformis Stretch Left;3 reps;20 seconds      Knee/Hip Exercises: Aerobic   Nustep L 5 5 min      Knee/Hip Exercises: Machines for Strengthening   Cybex Knee Extension 5# 2 sets 10, then left only working on TKE end range x 10    Cybex Knee Flexion 25# 2x10, 15# x12 LLE only    Cybex Leg Press 20# 2x10      Knee/Hip Exercises: Standing   Walking with Sports Cord with tband all directions    Other Standing Knee  Exercises SLS 5# dead lift      Knee/Hip Exercises: Supine   Other Supine Knee/Hip Exercises feet on ball K2C, trunk rotation, small bridge, isometric abs      Modalities   Modalities Iontophoresis      Iontophoresis   Type of Iontophoresis Dexamethasone    Location Left Lat knee    Dose 31m    Time 4 hour patch                       PT Short Term Goals - 05/12/21 0959       PT SHORT TERM GOAL #1   Title independent with initial HEP    Status Achieved               PT Long Term Goals - 05/26/21 1053       PT LONG TERM GOAL #1   Title report overall pain relief 50% without prednisome      PT LONG TERM GOAL #2  Title go up and down stairs step over step wihtout pain    Status On-going      PT LONG TERM GOAL #3   Title indepednent advanced HEP    Status On-going                   Plan - 05/26/21 1051     Clinical Impression Statement Reports doing well, had some pain with resisted side stepping, I changed some exercises up for incresed strength and function, tolerated well with a few times of having some pain in the lasteral knee    PT Next Visit Plan continue to progress strength and flexibility    Consulted and Agree with Plan of Care Patient             Patient Hickman benefit from skilled therapeutic intervention in order to improve the following deficits and impairments:  Difficulty walking, Decreased activity tolerance, Pain, Impaired flexibility, Decreased strength, Decreased mobility  Visit Diagnosis: Acute pain of left knee  Difficulty in walking, not elsewhere classified     Problem List Patient Active Problem List   Diagnosis Date Noted   Shingles of eyelid 04/27/2021   Depression with anxiety 05/25/2020   Right hand pain 05/31/2016   Wrist injury 10/06/2015   Hyperlipidemia 03/03/2015   Dizzy 03/03/2015   Thrombocytopenia, unspecified ([place]) 05/29/2014   Hx of preterm delivery, currently pregnant 05/27/2014    Gestational hypertension 05/27/2014   Vaginal delivery 05/27/2014   Trisomy 21, child of prior pregnancy, currently pregnant 10/08/2013   Elderly multigravida with antepartum condition or complication [id number]   Rh negative state in antepartum period 10/08/2013   Obesity (BMI 30.0-34.9) 05/28/2013   Migraine 05/28/2013   Depression, major, single episode, moderate ([place]) 08/21/2008    Sumner Boast, PT 05/26/2021, 10:53 AM  [place]. [place], [place], [address] Phone: [phone]   Fax:  [phone]  Name: Stacey Hickman MRN: [id number] Date of Birth: 1971/03/23

## 2021-06-03 ENCOUNTER — Ambulatory Visit: Payer: 59 | Admitting: Physical Therapy

## 2021-06-04 ENCOUNTER — Other Ambulatory Visit: Payer: Self-pay

## 2021-06-04 ENCOUNTER — Ambulatory Visit: Payer: 59 | Admitting: Physical Therapy

## 2021-06-04 ENCOUNTER — Encounter: Payer: Self-pay | Admitting: Physical Therapy

## 2021-06-04 DIAGNOSIS — M25562 Pain in left knee: Secondary | ICD-10-CM | POA: Diagnosis not present

## 2021-06-04 DIAGNOSIS — R262 Difficulty in walking, not elsewhere classified: Secondary | ICD-10-CM

## 2021-06-04 NOTE — Therapy (Signed)
[place]. [place], [place], [address] Phone: [phone]   Fax:  [phone]  Physical Therapy Treatment  Patient Details  Name: Stacey Hickman MRN: [id number] Date of Birth: 1970/12/22 Referring Provider (PT): Aluisio   Encounter Date: 06/04/2021   PT End of Session - 06/04/21 1520     Visit Number 5    Authorization Type UHC    PT Start Time 5462    PT Stop Time 1523    PT Time Calculation (min) 40 min    Activity Tolerance Patient tolerated treatment well    Behavior During Therapy [facility] for tasks assessed/performed             Past Medical History:  Diagnosis Date   Depression    no problems   Dizziness    Gestational hypertension 05/27/2014   Headache(784.0)    Insomnia    Left knee injury 03/2011   was in immobilizer   Tendonitis of foot 11/2010   Right foot    Past Surgical History:  Procedure Laterality Date   IUD REMOVAL     OTHER SURGICAL HISTORY     laproscopic removal of IUD, perfed uterus   WISDOM TOOTH EXTRACTION      There were no vitals filed for this visit.   Subjective Assessment - 06/04/21 1445     Subjective I have been doing very well, until the weather changed today, I am hurting a little    Currently in Pain? Yes    Pain Score 2     Pain Location Knee    Pain Orientation Left;Anterior    Pain Descriptors / Indicators Aching    Aggravating Factors  weather changes  and sitting cross legged pain up to 5-6/10    Pain Relieving Factors the treatment seems to help                               [facility] Adult PT Treatment/Exercise - 06/04/21 0001       Knee/Hip Exercises: Stretches   Passive Hamstring Stretch Left;4 reps;20 seconds    Quad Stretch Left;3 reps;20 seconds    ITB Stretch Left;3 reps;20 seconds    Piriformis Stretch Left;3 reps;20 seconds      Knee/Hip Exercises: Aerobic   Nustep L 5 5 min      Knee/Hip Exercises: Machines for  Strengthening   Cybex Knee Extension 5# 4x5 with cues to TKE and to get VMO to contract    Cybex Knee Flexion 25# 2x10, 15# x12 LLE only    Cybex Leg Press 30# x10 both legs, 20# x10 left only      Knee/Hip Exercises: Standing   Walking with Sports Cord with tband all directions      Knee/Hip Exercises: Seated   Long Arc Quad Strengthening;Right;2 sets;10 reps    Long Arc Quad Weight 3 lbs.      Knee/Hip Exercises: Supine   Bridges with Diona Foley Squeeze Both;2 sets;10 reps      Modalities   Modalities Iontophoresis      Iontophoresis   Type of Iontophoresis Dexamethasone    Location Left Lat knee    Dose 56m    Time 4 hour patch                       PT Short Term Goals - 05/12/21 07035      PT  SHORT TERM GOAL #1   Title independent with initial HEP    Status Achieved               PT Long Term Goals - 06/04/21 1523       PT LONG TERM GOAL #1   Title report overall pain relief 50% without prednisome    Status Partially Met      PT LONG TERM GOAL #2   Title go up and down stairs step over step wihtout pain    Status Partially Met      PT LONG TERM GOAL #3   Title indepednent advanced HEP    Status Partially Met      PT LONG TERM GOAL #4   Title resume walking 30 minutes 2-3x/week wihtout increase of pain    Status Partially Met                   Plan - 06/04/21 1521     Clinical Impression Statement Patient continues to do well, she reports tha she really has been having no pain and taking no OTC medications fo rth epain, she does report the weather change today has her knee hurting some, she also has pain sitting cross legged.  Better tracking patella.  No limp,  still very tight in the HS, calves and ITB    PT Next Visit Plan continue to progress strength and flexibility    Consulted and Agree with Plan of Care Patient             Patient will benefit from skilled therapeutic intervention in order to improve the following  deficits and impairments:  Difficulty walking, Decreased activity tolerance, Pain, Impaired flexibility, Decreased strength, Decreased mobility  Visit Diagnosis: Acute pain of left knee  Difficulty in walking, not elsewhere classified     Problem List Patient Active Problem List   Diagnosis Date Noted   Shingles of eyelid 04/27/2021   Depression with anxiety 05/25/2020   Right hand pain 05/31/2016   Wrist injury 10/06/2015   Hyperlipidemia 03/03/2015   Dizzy 03/03/2015   Thrombocytopenia, unspecified ([place]) 05/29/2014   Hx of preterm delivery, currently pregnant 05/27/2014   Gestational hypertension 05/27/2014   Vaginal delivery 05/27/2014   Trisomy 21, child of prior pregnancy, currently pregnant 10/08/2013   Elderly multigravida with antepartum condition or complication 49/67/5916   Rh negative state in antepartum period 10/08/2013   Obesity (BMI 30.0-34.9) 05/28/2013   Migraine 05/28/2013   Depression, major, single episode, moderate ([place]) 08/21/2008    Sumner Boast, PT 06/04/2021, 3:25 PM  [place]. [place], [place], [address] Phone: [phone]   Fax:  [phone]  Name: Stacey Hickman MRN: [id number] Date of Birth: 1971-02-21

## 2021-06-09 ENCOUNTER — Ambulatory Visit: Payer: 59 | Admitting: Physical Therapy

## 2021-06-09 ENCOUNTER — Other Ambulatory Visit: Payer: Self-pay

## 2021-06-09 DIAGNOSIS — R262 Difficulty in walking, not elsewhere classified: Secondary | ICD-10-CM

## 2021-06-09 DIAGNOSIS — M25562 Pain in left knee: Secondary | ICD-10-CM

## 2021-06-09 NOTE — Therapy (Signed)
[place]. [place], [place], [address] Phone: [phone]   Fax:  [phone]  Physical Therapy Treatment  Patient Details  Name: Stacey Hickman MRN: [id number] Date of Birth: Jul 18, 1971 Referring Provider (PT): Aluisio   Encounter Date: 06/09/2021   PT End of Session - 06/09/21 0933     Visit Number 6    Number of Visits 23    PT Start Time 0927    PT Stop Time 1010    PT Time Calculation (min) 43 min             Past Medical History:  Diagnosis Date   Depression    no problems   Dizziness    Gestational hypertension 05/27/2014   Headache(784.0)    Insomnia    Left knee injury 03/2011   was in immobilizer   Tendonitis of foot 11/2010   Right foot    Past Surgical History:  Procedure Laterality Date   IUD REMOVAL     OTHER SURGICAL HISTORY     laproscopic removal of IUD, perfed uterus   WISDOM TOOTH EXTRACTION      There were no vitals filed for this visit.   Subjective Assessment - 06/09/21 0930     Subjective 80% better. weather still a factor but PT has helped alot. today is my last day and I feel good about continuing at home    Currently in Pain? Yes    Pain Score 2     Pain Location Knee    Pain Orientation Left;Anterior                               [facility] Adult PT Treatment/Exercise - 06/09/21 0001       Ambulation/Gait   Stairs --   no issues step over step without rail     Knee/Hip Exercises: Aerobic   Nustep L6 6 min LE only      Knee/Hip Exercises: Machines for Strengthening   Cybex Knee Extension 5# 4x5 with cues to TKE and to get VMO to contract   LLE only   Cybex Knee Flexion 15# 2 sets12    Cybex Leg Press 30# 3 sets 12 BIL feet 3 way   calf raises 40# 2 sets 12     Knee/Hip Exercises: Standing   Walking with Sports Cord 40# 3 x each 4 way    Other Standing Knee Exercises 5# DL 2 sets 10      Knee/Hip Exercises: Seated   Long Arc Quad  Strengthening;Left;2 sets;10 reps   VMP ball squeeze   Long Arc Quad Weight 5 lbs.    Other Seated Knee/Hip Exercises hip IR/ER 15x LLE 15 x      Iontophoresis   Type of Iontophoresis Dexamethasone    Location Left Lat knee    Dose 48m    Time 4 hour patch                       PT Short Term Goals - 05/12/21 0959       PT SHORT TERM GOAL #1   Title independent with initial HEP    Status Achieved               PT Long Term Goals - 06/09/21 0931       PT LONG TERM GOAL #1   Title report overall pain relief 50%  without prednisome    Status Achieved      PT LONG TERM GOAL #2   Title go up and down stairs step over step wihtout pain    Status Achieved      PT LONG TERM GOAL #3   Title indepednent advanced HEP    Status Achieved      PT LONG TERM GOAL #4   Title resume walking 30 minutes 2-3x/week wihtout increase of pain    Baseline limited by life not pain    Status Partially Met                   Plan - 06/09/21 0950     Clinical Impression Statement all goals met. still pian esp with cold weather. overall 80% better. pt feels good with D/C and HEP    PT Treatment/Interventions ADLs/Self Care Home Management;Cryotherapy;Electrical Stimulation;Iontophoresis 38m/ml Dexamethasone;Ultrasound;Gait training;Neuromuscular re-education;Balance training;Therapeutic exercise;Therapeutic activities;Functional mobility training;Stair training;Patient/family education;Dry needling    PT Next Visit Plan DC             Patient will benefit from skilled therapeutic intervention in order to improve the following deficits and impairments:  Difficulty walking, Decreased activity tolerance, Pain, Impaired flexibility, Decreased strength, Decreased mobility  Visit Diagnosis: Acute pain of left knee  Difficulty in walking, not elsewhere classified     Problem List Patient Active Problem List   Diagnosis Date Noted   Shingles of eyelid 04/27/2021    Depression with anxiety 05/25/2020   Right hand pain 05/31/2016   Wrist injury 10/06/2015   Hyperlipidemia 03/03/2015   Dizzy 03/03/2015   Th[place]openia, [place] ([place] 05/29/2014   Hx of preterm delivery, currently pregnant 05/27/2014   Gestational hypertension 05/27/2014   Vaginal delivery 05/27/2014   Trisomy 21, child of prior pregnancy, currently pregnant 10/08/2013   Elderly multigravida with antepartum condition or complication 032/10/3341  Rh negative state in antepartum period 10/08/2013   Obesity (BMI 30.0-34.9) 05/28/2013   Migraine 05/28/2013   Depression, major, single episode, moderate ([place] 08/21/2008  PHYSICAL THERAPY DISCHARGE SUMMARY   Patient agrees to discharge. Patient goals were met. Patient is being discharged due to meeting the stated rehab goals.   Ellis Mehaffey,ANGIE, PTA 06/09/2021, 10:01 AM  [place] [address]Phone: [phone]  Fax:  [phone] Name: Stacey CRANORMRN: [id number] of Birth: [birth year]

## 2021-06-17 ENCOUNTER — Other Ambulatory Visit: Payer: Self-pay | Admitting: Family Medicine

## 2021-06-17 DIAGNOSIS — F418 Other specified anxiety disorders: Secondary | ICD-10-CM

## 2021-06-20 ENCOUNTER — Other Ambulatory Visit: Payer: Self-pay | Admitting: Family Medicine

## 2021-06-20 DIAGNOSIS — F418 Other specified anxiety disorders: Secondary | ICD-10-CM

## 2021-06-20 DIAGNOSIS — G43809 Other migraine, not intractable, without status migrainosus: Secondary | ICD-10-CM

## 2021-08-01 ENCOUNTER — Other Ambulatory Visit: Payer: Self-pay | Admitting: Family Medicine

## 2021-08-01 DIAGNOSIS — F418 Other specified anxiety disorders: Secondary | ICD-10-CM

## 2021-09-15 ENCOUNTER — Encounter (INDEPENDENT_AMBULATORY_CARE_PROVIDER_SITE_OTHER): Payer: 59 | Admitting: Ophthalmology

## 2021-09-15 ENCOUNTER — Other Ambulatory Visit: Payer: Self-pay

## 2021-09-15 DIAGNOSIS — H33303 Unspecified retinal break, bilateral: Secondary | ICD-10-CM

## 2021-09-15 DIAGNOSIS — H43813 Vitreous degeneration, bilateral: Secondary | ICD-10-CM | POA: Diagnosis not present

## 2021-12-23 ENCOUNTER — Other Ambulatory Visit: Payer: Self-pay | Admitting: Family Medicine

## 2021-12-23 DIAGNOSIS — F418 Other specified anxiety disorders: Secondary | ICD-10-CM

## 2021-12-23 DIAGNOSIS — G43809 Other migraine, not intractable, without status migrainosus: Secondary | ICD-10-CM

## 2022-01-21 ENCOUNTER — Other Ambulatory Visit: Payer: Self-pay | Admitting: Family Medicine

## 2022-01-21 DIAGNOSIS — G43809 Other migraine, not intractable, without status migrainosus: Secondary | ICD-10-CM

## 2022-01-22 ENCOUNTER — Ambulatory Visit (INDEPENDENT_AMBULATORY_CARE_PROVIDER_SITE_OTHER): Payer: 59 | Admitting: Family Medicine

## 2022-01-22 ENCOUNTER — Other Ambulatory Visit: Payer: Self-pay | Admitting: Family Medicine

## 2022-01-22 ENCOUNTER — Telehealth: Payer: Self-pay

## 2022-01-22 ENCOUNTER — Encounter: Payer: Self-pay | Admitting: Family Medicine

## 2022-01-22 DIAGNOSIS — F321 Major depressive disorder, single episode, moderate: Secondary | ICD-10-CM

## 2022-01-22 DIAGNOSIS — F418 Other specified anxiety disorders: Secondary | ICD-10-CM | POA: Diagnosis not present

## 2022-01-22 DIAGNOSIS — Z79899 Other long term (current) drug therapy: Secondary | ICD-10-CM

## 2022-01-22 DIAGNOSIS — B0239 Other herpes zoster eye disease: Secondary | ICD-10-CM | POA: Diagnosis not present

## 2022-01-22 DIAGNOSIS — G43809 Other migraine, not intractable, without status migrainosus: Secondary | ICD-10-CM

## 2022-01-22 DIAGNOSIS — E669 Obesity, unspecified: Secondary | ICD-10-CM

## 2022-01-22 MED ORDER — ALPRAZOLAM 0.25 MG PO TABS: 0.2500 mg | ORAL_TABLET | Freq: Two times a day (BID) | ORAL | 0 refills | Status: DC | PRN

## 2022-01-22 MED ORDER — GABAPENTIN 100 MG PO CAPS: 100.0000 mg | ORAL_CAPSULE | Freq: Three times a day (TID) | ORAL | 3 refills | Status: DC

## 2022-01-22 MED ORDER — BUPROPION HCL ER (XL) 300 MG PO TB24: 300.0000 mg | ORAL_TABLET | Freq: Every day | ORAL | 1 refills | Status: DC

## 2022-01-22 MED ORDER — WEGOVY 0.25 MG/0.5ML ~~LOC~~ SOAJ: 0.2500 mg | SUBCUTANEOUS | 0 refills | Status: DC

## 2022-01-22 MED ORDER — TOPIRAMATE 50 MG PO TABS: 100.0000 mg | ORAL_TABLET | Freq: Two times a day (BID) | ORAL | 0 refills | Status: DC

## 2022-01-22 MED ORDER — ARIPIPRAZOLE 5 MG PO TABS: 5.0000 mg | ORAL_TABLET | Freq: Every day | ORAL | 1 refills | Status: DC

## 2022-01-22 MED ORDER — DESVENLAFAXINE SUCCINATE ER 100 MG PO TB24: 100.0000 mg | ORAL_TABLET | Freq: Every day | ORAL | 3 refills | Status: DC

## 2022-01-22 MED ORDER — SUMATRIPTAN SUCCINATE 100 MG PO TABS: 100.0000 mg | ORAL_TABLET | ORAL | 3 refills | Status: DC | PRN

## 2022-01-22 NOTE — Progress Notes (Signed)
? ?Subjective:  ? ?By signing my name below, I, Stacey Hickman, attest that this documentation has been prepared under the direction and in the presence of [name], DO. 01/22/2022 ? ? Patient ID: Stacey Hickman, female    DOB: 08/21/1971, 51 y.o.   MRN: [id number] ? ?Chief Complaint  ?Patient presents with  ? Depression  ? Anxiety  ? Follow-up  ? ? ?HPI ?Patient is in today for an office visit. ? ?She is interested in starting [place]. At last visit, insurance did not cover it. She will like to increase the dosage of Wellbutrin. ? ?She has to fill out a urine drug screening at this visit.  ? ?Past Medical History:  ?Diagnosis Date  ? Depression   ? no problems  ? Dizziness   ? Gestational hypertension 05/27/2014  ? Headache(784.0)   ? Insomnia   ? Left knee injury 03/2011  ? was in immobilizer  ? Tendonitis of foot 11/2010  ? Right foot  ? ? ?Past Surgical History:  ?Procedure Laterality Date  ? IUD REMOVAL    ? OTHER SURGICAL HISTORY    ? laproscopic removal of IUD, perfed uterus  ? WISDOM TOOTH EXTRACTION    ? ? ?Family History  ?Problem Relation Age of Onset  ? Obesity Mother   ? Diabetes Mother   ?     Prediabetic  ? Hypothyroidism Mother   ? Obesity Sister   ? Diabetes Brother   ? Obesity Brother   ? Diabetes Brother   ? Hypertension Brother   ? Hyperlipidemia Brother   ? Hypothyroidism Brother   ? Diabetes Father   ? Obesity Father   ? Diabetes Paternal Grandfather   ? Colon polyps Neg Hx   ? Colon cancer Neg Hx   ? Esophageal cancer Neg Hx   ? Stomach cancer Neg Hx   ? Rectal cancer Neg Hx   ? ? ?Social History  ? ?Socioeconomic History  ? Marital status: Married  ?  Spouse name: Not on file  ? Number of children: 3  ? Years of education: Not on file  ? Highest education level: Not on file  ?Occupational History  ? Occupation: PHARMACIST  ?  Comment: [employer]  ?Tobacco Use  ? Smoking status: Never  ? Smokeless tobacco: Never  ?Vaping Use  ? Vaping Use: Never used  ?Substance and Sexual Activity  ? Alcohol  use: Yes  ?  Comment: occ, not with preg  ? Drug use: No  ? Sexual activity: Yes  ?  Partners: Male  ?Other Topics Concern  ? Not on file  ?Social History Narrative  ? Exercising--  30 min 2x a week--- walking  ? ?Social Determinants of Health  ? ?Financial Resource Strain: Not on file  ?Food Insecurity: Not on file  ?Transportation Needs: Not on file  ?Physical Activity: Not on file  ?Stress: Not on file  ?Social Connections: Not on file  ?Intimate Partner Violence: Not on file  ? ? ?Outpatient Medications Prior to Visit  ?Medication Sig Dispense Refill  ? fluticasone (FLONASE) 50 MCG/ACT nasal spray Place 2 sprays into both nostrils daily. 16 g 6  ? ibuprofen (ADVIL,MOTRIN) 600 MG tablet Take 1 tablet (600 mg total) by mouth every 6 (six) hours. 30 tablet 0  ? levonorgestrel (MIRENA) 20 MCG/24HR IUD 1 each by Intrauterine route once.    ? spironolactone (ALDACTONE) 50 MG tablet Take 50 mg by mouth daily.    ? ALPRAZolam Duanne Moron)  0.25 MG tablet TAKE 1 TABLET BY MOUTH 2 TIMES DAILY AS NEEDED FOR ANXIETY. 20 tablet 0  ? ARIPiprazole (ABILIFY) 5 MG tablet TAKE 1 TABLET (5 MG TOTAL) BY MOUTH DAILY. 90 tablet 1  ? buPROPion (WELLBUTRIN XL) 150 MG 24 hr tablet TAKE 1 TABLET BY MOUTH EVERY DAY 90 tablet 0  ? desvenlafaxine (PRISTIQ) 100 MG 24 hr tablet TAKE 1 TABLET BY MOUTH EVERY DAY 90 tablet 3  ? famciclovir (FAMVIR) 500 MG tablet Take 500 mg by mouth 3 (three) times daily.    ? gabapentin (NEURONTIN) 100 MG capsule Take 1 capsule (100 mg total) by mouth 3 (three) times daily. 90 capsule 3  ? predniSONE (DELTASONE) 10 MG tablet TAKE 3 TABLETS PO QD FOR 3 DAYS THEN TAKE 2 TABLETS PO QD FOR 3 DAYS THEN TAKE 1 TABLET PO QD FOR 3 DAYS THEN TAKE 1/2 TAB PO QD FOR 3 DAYS 20 tablet 0  ? SUMAtriptan (IMITREX) 100 MG tablet Take 1 tablet (100 mg total) by mouth every 2 (two) hours as needed for migraine. Max 2 per 24hrs 10 tablet 3  ? topiramate (TOPAMAX) 50 MG tablet TAKE 2 TABLETS BY MOUTH TWICE A DAY 120 tablet 0  ? ?No  facility-administered medications prior to visit.  ? ? ?No Known Allergies ? ?Review of Systems  ?Constitutional:  Negative for fever.  ?HENT:  Negative for congestion, ear pain, hearing loss, sinus pain and sore throat.   ?Eyes:  Negative for blurred vision and pain.  ?Respiratory:  Negative for cough, sputum production, shortness of breath and wheezing.   ?Cardiovascular:  Negative for chest pain and palpitations.  ?Gastrointestinal:  Negative for blood in stool, constipation, diarrhea, nausea and vomiting.  ?Genitourinary:  Negative for dysuria, frequency, hematuria and urgency.  ?Musculoskeletal:  Negative for back pain, falls and myalgias.  ?Neurological:  Negative for dizziness, sensory change, loss of consciousness, weakness and headaches.  ?Endo/Heme/Allergies:  Negative for environmental allergies. Does not bruise/bleed easily.  ?Psychiatric/Behavioral:  Negative for depression and suicidal ideas. The patient is not nervous/anxious and does not have insomnia.   ? ?   ?Objective:  ?  ?Physical Exam ?Constitutional:   ?   General: She is not in acute distress. ?   Appearance: Normal appearance. She is not ill-appearing.  ?HENT:  ?   Head: Normocephalic and atraumatic.  ?   Right Ear: External ear normal.  ?   Left Ear: External ear normal.  ?Eyes:  ?   Extraocular Movements: Extraocular movements intact.  ?   Pupils: Pupils are equal, round, and reactive to light.  ?Cardiovascular:  ?   Rate and Rhythm: Normal rate and regular rhythm.  ?   Pulses: Normal pulses.  ?   Heart sounds: Normal heart sounds. No murmur heard. ?  No gallop.  ?Pulmonary:  ?   Effort: Pulmonary effort is normal. No respiratory distress.  ?   Breath sounds: Normal breath sounds. No wheezing, rhonchi or rales.  ?Abdominal:  ?   General: Bowel sounds are normal. There is no distension.  ?   Palpations: Abdomen is soft. There is no mass.  ?   Tenderness: There is no abdominal tenderness. There is no guarding or rebound.  ?   Hernia: No  hernia is present.  ?Musculoskeletal:  ?   Cervical back: Normal range of motion and neck supple.  ?Lymphadenopathy:  ?   Cervical: No cervical adenopathy.  ?Skin: ?   General: Skin is warm and dry.  ?  Neurological:  ?   Mental Status: She is alert and oriented to person, place, and time.  ?Psychiatric:     ?   Behavior: Behavior normal.  ? ? ?BP 108/80 (BP Location: Left Arm, Patient Position: Sitting, Cuff Size: Normal)   Pulse 89   Temp 97.9 ?F (36.6 ?C) (Oral)   Resp 18   Ht '5\' 3"'$  (1.6 m)   Wt 183 lb 9.6 oz (83.3 kg)   SpO2 98%   BMI 32.52 kg/m?  ?Wt Readings from Last 3 Encounters:  ?01/22/22 183 lb 9.6 oz (83.3 kg)  ?04/27/21 183 lb 6.4 oz (83.2 kg)  ?02/11/21 174 lb (78.9 kg)  ? ? ?Diabetic Foot Exam - Simple   ?No data filed ?  ? ?Lab Results  ?Component Value Date  ? WBC 6.2 11/20/2020  ? HGB 15.8 (H) 11/20/2020  ? HCT 47.1 (H) 11/20/2020  ? PLT 167.0 11/20/2020  ? GLUCOSE 89 11/20/2020  ? CHOL 167 11/20/2020  ? TRIG 146.0 11/20/2020  ? HDL 40.60 11/20/2020  ? [place] 97 11/20/2020  ? ALT 29 11/20/2020  ? AST 22 11/20/2020  ? NA 141 11/20/2020  ? K 4.1 11/20/2020  ? CL 108 11/20/2020  ? CREATININE 1.11 11/20/2020  ? BUN 17 11/20/2020  ? CO2 28 11/20/2020  ? TSH 4.44 12/18/2020  ? HGBA1C 4.9 11/20/2020  ? ? ?Lab Results  ?Component Value Date  ? TSH 4.44 12/18/2020  ? ?Lab Results  ?Component Value Date  ? WBC 6.2 11/20/2020  ? HGB 15.8 (H) 11/20/2020  ? HCT 47.1 (H) 11/20/2020  ? MCV 91.7 11/20/2020  ? PLT 167.0 11/20/2020  ? ?Lab Results  ?Component Value Date  ? NA 141 11/20/2020  ? K 4.1 11/20/2020  ? CO2 28 11/20/2020  ? GLUCOSE 89 11/20/2020  ? BUN 17 11/20/2020  ? CREATININE 1.11 11/20/2020  ? BILITOT 0.6 11/20/2020  ? ALKPHOS 47 11/20/2020  ? AST 22 11/20/2020  ? ALT 29 11/20/2020  ? PROT 7.5 11/20/2020  ? ALBUMIN 4.4 11/20/2020  ? CALCIUM 9.6 11/20/2020  ? ANIONGAP 14 05/27/2014  ? GFR 58.18 (L) 11/20/2020  ? ?Lab Results  ?Component Value Date  ? CHOL 167 11/20/2020  ? ?Lab Results   ?Component Value Date  ? HDL 40.60 11/20/2020  ? ?Lab Results  ?Component Value Date  ? [place] 97 11/20/2020  ? ?Lab Results  ?Component Value Date  ? TRIG 146.0 11/20/2020  ? ?Lab Results  ?Component Value Date  ? CHOLHDL

## 2022-01-22 NOTE — Assessment & Plan Note (Signed)
Stable  ?con't meds -- wellbutrin xl  ?

## 2022-01-22 NOTE — Assessment & Plan Note (Signed)
Will try for wegovy again ?Pt will also speak with HR to see if they will start covering weight loss med s  ?

## 2022-01-22 NOTE — Patient Instructions (Signed)
Obesity, Adult Obesity is the condition of having too much total body fat. Being overweight or obese means that your weight is greater than what is considered healthy for your body size. Obesity is determined by a measurement called BMI (body mass index). BMI is an estimate of body fat and is calculated from height and weight. For adults, a BMI of 30 or higher is considered obese. Obesity can lead to other health concerns and major illnesses, including: Stroke. Coronary artery disease (CAD). Type 2 diabetes. Some types of cancer, including cancers of the colon, breast, uterus, and gallbladder. High blood pressure (hypertension). High cholesterol. Gallbladder stones. Obesity can also contribute to: Osteoarthritis. Sleep apnea. Infertility problems. What are the causes? Common causes of this condition include: Eating daily meals that are high in calories, sugar, and fat. Drinking high amounts of sugar-sweetened beverages, such as soft drinks. Being born with genes that may make you more likely to become obese. Having a medical condition that causes obesity, including: Hypothyroidism. Polycystic ovarian syndrome (PCOS). Binge-eating disorder. Cushing syndrome. Taking certain medicines, such as steroids, antidepressants, and seizure medicines. Not being physically active (sedentary lifestyle). Not getting enough sleep. What increases the risk? The following factors may make you more likely to develop this condition: Having a family history of obesity. Living in an area with limited access to: Parks, recreation centers, or sidewalks. Healthy food choices, such as grocery stores and farmers' markets. What are the signs or symptoms? The main sign of this condition is having too much body fat. How is this diagnosed? This condition is diagnosed based on: Your BMI. If you are an adult with a BMI of 30 or higher, you are considered obese. Your waist circumference. This measures the  distance around your waistline. Your skinfold thickness. Your health care provider may gently pinch a fold of your skin and measure it. You may have other tests to check for underlying conditions. How is this treated? Treatment for this condition often includes changing your lifestyle. Treatment may include some or all of the following: Dietary changes. This may include developing a healthy meal plan. Regular physical activity. This may include activity that causes your heart to beat faster (aerobic exercise) and strength training. Work with your health care provider to design an exercise program that works for you. Medicine to help you lose weight if you are unable to lose one pound a week after six weeks of healthy eating and more physical activity. Treating conditions that cause the obesity (underlying conditions). Surgery. Surgical options may include gastric banding and gastric bypass. Surgery may be done if: Other treatments have not helped to improve your condition. You have a BMI of 40 or higher. You have life-threatening health problems related to obesity. Follow these instructions at home: Eating and drinking  Follow recommendations from your health care provider about what you eat and drink. Your health care provider may advise you to: Limit fast food, sweets, and processed snack foods. Choose low-fat options, such as low-fat milk instead of whole milk. Eat five or more servings of fruits or vegetables every day. Choose healthy foods when you eat out. Keep low-fat snacks available. Limit sugary drinks, such as soda, fruit juice, sweetened iced tea, and flavored milk. Drink enough water to keep your urine pale yellow. Do not follow a fad diet. Fad diets can be unhealthy and even dangerous. Other healthful choices include: Eat at home more often. This gives you more control over what you eat. Learn to read food labels.   This will help you understand how much food is considered one  serving. Learn what a healthy serving size is. Physical activity Exercise regularly, as told by your health care provider. Most adults should get up to 150 minutes of moderate-intensity exercise every week. Ask your health care provider what types of exercise are safe for you and how often you should exercise. Warm up and stretch before being active. Cool down and stretch after being active. Rest between periods of activity. Lifestyle Work with your health care provider and a dietitian to set a weight-loss goal that is healthy and reasonable for you. Limit your screen time. Find ways to reward yourself that do not involve food. Do not drink alcohol if: Your health care provider tells you not to drink. You are pregnant, may be pregnant, or are planning to become pregnant. If you drink alcohol: Limit how much you have to: 0-1 drink a day for women. 0-2 drinks a day for men. Know how much alcohol is in your drink. In the [place] one drink equals one 12 oz bottle of beer (355 mL), one 5 oz glass of wine (148 mL), or one 1 oz glass of hard liquor (44 mL). General instructions Keep a weight-loss journal to keep track of the food you eat and how much exercise you get. Take over-the-counter and prescription medicines only as told by your health care provider. Take vitamins and supplements only as told by your health care provider. Consider joining a support group. Your health care provider may be able to recommend a support group. Pay attention to your mental health as obesity can lead to depression or self esteem issues. Keep all follow-up visits. This is important. Contact a health care provider if: You are unable to meet your weight-loss goal after six weeks of dietary and lifestyle changes. You have trouble breathing. Summary Obesity is the condition of having too much total body fat. Being overweight or obese means that your weight is greater than what is considered healthy for your body  size. Work with your health care provider and a dietitian to set a weight-loss goal that is healthy and reasonable for you. Exercise regularly, as told by your health care provider. Ask your health care provider what types of exercise are safe for you and how often you should exercise. This information is not intended to replace advice given to you by your health care provider. Make sure you discuss any questions you have with your health care provider. Document Revised: 04/07/2021 Document Reviewed: 04/07/2021 Elsevier Patient Education  2023 Elsevier Inc.  

## 2022-01-22 NOTE — Telephone Encounter (Signed)
PA initiated via Covermymeds; KEY: [id number]. Awaiting determination.  ?

## 2022-01-23 ENCOUNTER — Encounter: Payer: Self-pay | Admitting: Family Medicine

## 2022-01-24 LAB — DM TEMPLATE

## 2022-01-24 LAB — DRUG MONITORING PANEL 376104, URINE
Amphetamines: NEGATIVE ng/mL (ref <500)
Barbiturates: NEGATIVE ng/mL (ref <300)
Benzodiazepines: NEGATIVE ng/mL (ref <100)
Cocaine Metabolite: NEGATIVE ng/mL (ref <150)
Desmethyltramadol: NEGATIVE ng/mL (ref <100)
Opiates: NEGATIVE ng/mL (ref <100)
Oxycodone: NEGATIVE ng/mL (ref <100)
Tramadol: NEGATIVE ng/mL (ref <100)

## 2022-01-25 ENCOUNTER — Other Ambulatory Visit: Payer: Self-pay | Admitting: Family Medicine

## 2022-01-25 ENCOUNTER — Other Ambulatory Visit (HOSPITAL_BASED_OUTPATIENT_CLINIC_OR_DEPARTMENT_OTHER): Payer: Self-pay

## 2022-01-25 DIAGNOSIS — R110 Nausea: Secondary | ICD-10-CM

## 2022-01-25 MED ORDER — MECLIZINE HCL 25 MG PO TABS
25.0000 mg | ORAL_TABLET | Freq: Three times a day (TID) | ORAL | 0 refills | Status: DC | PRN
  Filled 2022-01-25: qty 30, 10d supply, fill #0

## 2022-01-25 MED ORDER — ONDANSETRON HCL 4 MG PO TABS
4.0000 mg | ORAL_TABLET | Freq: Three times a day (TID) | ORAL | 0 refills | Status: DC | PRN
  Filled 2022-01-25: qty 20, 7d supply, fill #0

## 2022-01-25 NOTE — Telephone Encounter (Signed)
PA denied. The requested medication and/or diagnosis are not a covered benefit and excluded from coverage in ?accordance with the terms and conditions of your plan benefit. Therefore, the request has been ?administratively denied. ?

## 2022-01-25 NOTE — Telephone Encounter (Signed)
Pt was called. VM was full. Unable to leave a VM ?

## 2022-02-16 ENCOUNTER — Other Ambulatory Visit: Payer: Self-pay | Admitting: Family Medicine

## 2022-02-16 DIAGNOSIS — G43809 Other migraine, not intractable, without status migrainosus: Secondary | ICD-10-CM

## 2022-04-09 ENCOUNTER — Other Ambulatory Visit (HOSPITAL_BASED_OUTPATIENT_CLINIC_OR_DEPARTMENT_OTHER): Payer: Self-pay

## 2022-06-11 ENCOUNTER — Encounter: Payer: Self-pay | Admitting: Family Medicine

## 2022-06-11 ENCOUNTER — Ambulatory Visit: Payer: 59 | Admitting: Family Medicine

## 2022-06-11 VITALS — BP 98/70 | HR 80 | Temp 97.8°F | Resp 18 | Ht 63.0 in | Wt 177.0 lb

## 2022-06-11 DIAGNOSIS — Z23 Encounter for immunization: Secondary | ICD-10-CM | POA: Diagnosis not present

## 2022-06-11 DIAGNOSIS — G8929 Other chronic pain: Secondary | ICD-10-CM | POA: Insufficient documentation

## 2022-06-11 DIAGNOSIS — M25512 Pain in left shoulder: Secondary | ICD-10-CM

## 2022-06-11 MED ORDER — PREDNISONE 10 MG PO TABS: ORAL_TABLET | ORAL | 0 refills | Status: DC

## 2022-06-11 MED ORDER — CYCLOBENZAPRINE HCL 10 MG PO TABS: 10.0000 mg | ORAL_TABLET | Freq: Three times a day (TID) | ORAL | 0 refills | Status: DC | PRN

## 2022-06-11 NOTE — Assessment & Plan Note (Signed)
pred taper  Refer to ortho  Call or rto prn

## 2022-06-11 NOTE — Progress Notes (Signed)
Established Patient Office Visit  Subjective   Patient ID: Stacey Hickman, female    DOB: 11/03/1970  Age: 51 y.o. MRN: [id number]  Chief Complaint  Patient presents with   Shoulder Pain    Pt states having left shoulder and arm pain that started at the beginning of August. Tingling in the fingers. Pt unable to lift arm pass certain point.     HPI Pt is here c/o pain L shoulder and arm since august ---- she remembers playing volleyball and felling a sharp pain when she lifted her L arm and pain shoots to elbow.    Driving can cause numbness and tingling in that arm.   Ib helps some but not much Patient Active Problem List   Diagnosis Date Noted   Chronic left shoulder pain 06/11/2022   Shingles of eyelid 04/27/2021   Depression with anxiety 05/25/2020   Right hand pain 05/31/2016   Wrist injury 10/06/2015   Hyperlipidemia 03/03/2015   Dizzy 03/03/2015   Thrombocytopenia, unspecified ([place]) 05/29/2014   Hx of preterm delivery, currently pregnant 05/27/2014   Gestational hypertension 05/27/2014   Vaginal delivery 05/27/2014   Trisomy 21, child of prior pregnancy, currently pregnant 10/08/2013   Elderly multigravida with antepartum condition or complication 63/87/5643   Rh negative state in antepartum period 10/08/2013   Obesity (BMI 30.0-34.9) 05/28/2013   Migraine 05/28/2013   Depression, major, single episode, moderate ([place]) 08/21/2008   Past Medical History:  Diagnosis Date   Depression    no problems   Dizziness    Gestational hypertension 05/27/2014   Headache(784.0)    Insomnia    Left knee injury 03/2011   was in immobilizer   Tendonitis of foot 11/2010   Right foot   Past Surgical History:  Procedure Laterality Date   IUD REMOVAL     OTHER SURGICAL HISTORY     laproscopic removal of IUD, perfed uterus   WISDOM TOOTH EXTRACTION     Social History   Tobacco Use   Smoking status: Never   Smokeless tobacco: Never  Vaping Use   Vaping Use: Never used   Substance Use Topics   Alcohol use: Yes    Comment: occ, not with preg   Drug use: No   Social History   Socioeconomic History   Marital status: Married    Spouse name: Not on file   Number of children: 3   Years of education: Not on file   Highest education level: Not on file  Occupational History   Occupation: PHARMACIST    Comment: [employer]  Tobacco Use   Smoking status: Never   Smokeless tobacco: Never  Vaping Use   Vaping Use: Never used  Substance and Sexual Activity   Alcohol use: Yes    Comment: occ, not with preg   Drug use: No   Sexual activity: Yes    Partners: Male  Other Topics Concern   Not on file  Social History Narrative   Exercising--  30 min 2x a week--- walking   Social Determinants of Health   Financial Resource Strain: Not on file  Food Insecurity: Not on file  Transportation Needs: Not on file  Physical Activity: Not on file  Stress: Not on file  Social Connections: Not on file  Intimate Partner Violence: Not on file   Family Status  Relation Name Status   Mother  Alive   Sister  Alive   Brother  Alive   Brother  Alive   Father  Alive   PGF  Deceased   Brother  Alive   Brother  Alive   Brother  Alive   Neg Hx  (Not Specified)   Family History  Problem Relation Age of Onset   Obesity Mother    Diabetes Mother        Prediabetic   Hypothyroidism Mother    Obesity Sister    Diabetes Brother    Obesity Brother    Diabetes Brother    Hypertension Brother    Hyperlipidemia Brother    Hypothyroidism Brother    Diabetes Father    Obesity Father    Diabetes Paternal Grandfather    Colon polyps Neg Hx    Colon cancer Neg Hx    Esophageal cancer Neg Hx    Stomach cancer Neg Hx    Rectal cancer Neg Hx    No Known Allergies    Review of Systems  Constitutional:  Negative for fever and malaise/fatigue.  HENT:  Negative for congestion.   Eyes:  Negative for blurred vision.  Respiratory:  Negative for shortness of breath.    Cardiovascular:  Negative for chest pain, palpitations and leg swelling.  Gastrointestinal:  Negative for abdominal pain, blood in stool and nausea.  Genitourinary:  Negative for dysuria and frequency.  Musculoskeletal:  Negative for falls.  Skin:  Negative for rash.  Neurological:  Negative for dizziness, loss of consciousness and headaches.  Endo/Heme/Allergies:  Negative for environmental allergies.  Psychiatric/Behavioral:  Negative for depression. The patient is not nervous/anxious.       Objective:     BP 98/70 (BP Location: Right Arm, Patient Position: Sitting, Cuff Size: Large)   Pulse 80   Temp 97.8 F (36.6 C) (Oral)   Resp 18   Ht '5\' 3"'$  (1.6 m)   Wt 177 lb (80.3 kg)   SpO2 96%   BMI 31.35 kg/m  BP Readings from Last 3 Encounters:  06/11/22 98/70  01/22/22 108/80  04/27/21 110/88   Wt Readings from Last 3 Encounters:  06/11/22 177 lb (80.3 kg)  01/22/22 183 lb 9.6 oz (83.3 kg)  04/27/21 183 lb 6.4 oz (83.2 kg)   SpO2 Readings from Last 3 Encounters:  06/11/22 96%  01/22/22 98%  04/27/21 98%      Physical Exam Vitals and nursing note reviewed.  Constitutional:      Appearance: She is well-developed.  HENT:     Head: Normocephalic and atraumatic.  Eyes:     Conjunctiva/sclera: Conjunctivae normal.  Neck:     Thyroid: No thyromegaly.     Vascular: No carotid bruit or JVD.  Cardiovascular:     Rate and Rhythm: Normal rate and regular rhythm.     Heart sounds: Normal heart sounds. No murmur heard. Pulmonary:     Effort: Pulmonary effort is normal. No respiratory distress.     Breath sounds: Normal breath sounds. No wheezing or rales.  Chest:     Chest wall: No tenderness.  Musculoskeletal:        General: Tenderness present.     Left shoulder: Tenderness present. Decreased range of motion. Decreased strength.     Cervical back: Normal range of motion and neck supple.  Neurological:     Mental Status: She is alert and oriented to person, place,  and time.      No results found for any visits on 06/11/22.  Last CBC Lab Results  Component Value Date   WBC 6.2 11/20/2020   HGB 15.8 (H) 11/20/2020  HCT 47.1 (H) 11/20/2020   MCV 91.7 11/20/2020   MCH 28.8 05/28/2014   RDW 12.6 11/20/2020   PLT 167.0 46/65/9935   Last metabolic panel Lab Results  Component Value Date   GLUCOSE 89 11/20/2020   NA 141 11/20/2020   K 4.1 11/20/2020   CL 108 11/20/2020   CO2 28 11/20/2020   BUN 17 11/20/2020   CREATININE 1.11 11/20/2020   GFRNONAA >90 05/27/2014   CALCIUM 9.6 11/20/2020   PROT 7.5 11/20/2020   ALBUMIN 4.4 11/20/2020   BILITOT 0.6 11/20/2020   ALKPHOS 47 11/20/2020   AST 22 11/20/2020   ALT 29 11/20/2020   ANIONGAP 14 05/27/2014   Last lipids Lab Results  Component Value Date   CHOL 167 11/20/2020   HDL 40.60 11/20/2020   LDLCALC 97 11/20/2020   TRIG 146.0 11/20/2020   CHOLHDL 4 11/20/2020   Last hemoglobin A1c Lab Results  Component Value Date   HGBA1C 4.9 11/20/2020   Last thyroid functions Lab Results  Component Value Date   TSH 4.44 12/18/2020   T4TOTAL 6.1 12/18/2020   Last vitamin D Lab Results  Component Value Date   VD25OH 41.88 11/20/2020   Last vitamin B12 and Folate Lab Results  Component Value Date   VITAMINB12 402 09/04/2014   FOLATE 12.2 08/20/2008      The 10-year ASCVD risk score (Arnett DK, et al., 2019) is: 0.9%    Assessment & Plan:   Problem List Items Addressed This Visit       Unprioritized   Chronic left shoulder pain - Primary    pred taper  Refer to ortho  Call or rto prn       Relevant Medications   predniSONE (DELTASONE) 10 MG tablet   cyclobenzaprine (FLEXERIL) 10 MG tablet   Other Relevant Orders   Ambulatory referral to Orthopedic Surgery   Other Visit Diagnoses     Need for influenza vaccination       Relevant Orders   Flu Vaccine QUAD 83moIM (Fluarix, Fluzone & Alfiuria Quad PF) (Completed)       No follow-ups on file.    YAnn Held DO

## 2022-07-13 LAB — HM PAP SMEAR: HPV, high-risk: NEGATIVE

## 2022-07-23 ENCOUNTER — Other Ambulatory Visit: Payer: Self-pay | Admitting: Family Medicine

## 2022-08-15 ENCOUNTER — Other Ambulatory Visit: Payer: Self-pay | Admitting: Family Medicine

## 2022-08-15 DIAGNOSIS — G43809 Other migraine, not intractable, without status migrainosus: Secondary | ICD-10-CM

## 2022-08-23 ENCOUNTER — Other Ambulatory Visit: Payer: Self-pay | Admitting: Family Medicine

## 2022-08-23 DIAGNOSIS — F418 Other specified anxiety disorders: Secondary | ICD-10-CM

## 2022-10-27 ENCOUNTER — Other Ambulatory Visit: Payer: Self-pay | Admitting: Family Medicine

## 2022-10-27 DIAGNOSIS — B0239 Other herpes zoster eye disease: Secondary | ICD-10-CM

## 2022-11-01 ENCOUNTER — Encounter: Payer: Self-pay | Admitting: Family Medicine

## 2022-11-02 ENCOUNTER — Other Ambulatory Visit (HOSPITAL_BASED_OUTPATIENT_CLINIC_OR_DEPARTMENT_OTHER): Payer: Self-pay

## 2022-11-02 MED ORDER — ARIPIPRAZOLE 10 MG PO TABS
10.0000 mg | ORAL_TABLET | Freq: Every day | ORAL | 0 refills | Status: DC
  Filled 2022-11-02: qty 30, 30d supply, fill #0
  Filled 2022-12-15: qty 30, 30d supply, fill #1

## 2022-11-22 ENCOUNTER — Other Ambulatory Visit: Payer: Self-pay | Admitting: Family Medicine

## 2022-11-22 DIAGNOSIS — F418 Other specified anxiety disorders: Secondary | ICD-10-CM

## 2022-11-23 ENCOUNTER — Other Ambulatory Visit: Payer: Self-pay | Admitting: Family Medicine

## 2022-12-02 ENCOUNTER — Other Ambulatory Visit: Payer: Self-pay | Admitting: Family Medicine

## 2022-12-02 DIAGNOSIS — F418 Other specified anxiety disorders: Secondary | ICD-10-CM

## 2022-12-03 NOTE — Telephone Encounter (Signed)
Requesting: alprazolam 0.25mg   Contract: 01/22/22 UDS: 01/22/22 Last Visit: 06/11/22 Next Visit: None Last Refill: 01/22/22 #20 and 0RF   Please Advise

## 2023-01-04 ENCOUNTER — Encounter: Payer: 59 | Admitting: Family Medicine

## 2023-01-06 ENCOUNTER — Ambulatory Visit (INDEPENDENT_AMBULATORY_CARE_PROVIDER_SITE_OTHER): Payer: 59 | Admitting: Family Medicine

## 2023-01-06 ENCOUNTER — Encounter: Payer: Self-pay | Admitting: Family Medicine

## 2023-01-06 VITALS — BP 110/80 | HR 88 | Temp 97.8°F | Resp 18 | Ht 63.0 in | Wt 181.4 lb

## 2023-01-06 DIAGNOSIS — E669 Obesity, unspecified: Secondary | ICD-10-CM

## 2023-01-06 DIAGNOSIS — G43809 Other migraine, not intractable, without status migrainosus: Secondary | ICD-10-CM

## 2023-01-06 DIAGNOSIS — E785 Hyperlipidemia, unspecified: Secondary | ICD-10-CM

## 2023-01-06 DIAGNOSIS — M25561 Pain in right knee: Secondary | ICD-10-CM

## 2023-01-06 DIAGNOSIS — Z23 Encounter for immunization: Secondary | ICD-10-CM | POA: Diagnosis not present

## 2023-01-06 DIAGNOSIS — L719 Rosacea, unspecified: Secondary | ICD-10-CM

## 2023-01-06 DIAGNOSIS — F321 Major depressive disorder, single episode, moderate: Secondary | ICD-10-CM

## 2023-01-06 DIAGNOSIS — F418 Other specified anxiety disorders: Secondary | ICD-10-CM

## 2023-01-06 DIAGNOSIS — M25562 Pain in left knee: Secondary | ICD-10-CM

## 2023-01-06 DIAGNOSIS — Z79899 Other long term (current) drug therapy: Secondary | ICD-10-CM | POA: Diagnosis not present

## 2023-01-06 DIAGNOSIS — G5621 Lesion of ulnar nerve, right upper limb: Secondary | ICD-10-CM

## 2023-01-06 DIAGNOSIS — G8929 Other chronic pain: Secondary | ICD-10-CM

## 2023-01-06 DIAGNOSIS — Z0000 Encounter for general adult medical examination without abnormal findings: Secondary | ICD-10-CM | POA: Diagnosis not present

## 2023-01-06 DIAGNOSIS — E66811 Obesity, class 1: Secondary | ICD-10-CM

## 2023-01-06 DIAGNOSIS — B0239 Other herpes zoster eye disease: Secondary | ICD-10-CM

## 2023-01-06 MED ORDER — TOPIRAMATE 50 MG PO TABS: 100.0000 mg | ORAL_TABLET | Freq: Two times a day (BID) | ORAL | 3 refills | Status: DC

## 2023-01-06 MED ORDER — BUPROPION HCL ER (XL) 300 MG PO TB24: 300.0000 mg | ORAL_TABLET | Freq: Every day | ORAL | 3 refills | Status: DC

## 2023-01-06 MED ORDER — SPIRONOLACTONE 50 MG PO TABS: 50.0000 mg | ORAL_TABLET | Freq: Every day | ORAL | 1 refills | Status: DC

## 2023-01-06 MED ORDER — PREDNISONE 10 MG PO TABS
ORAL_TABLET | ORAL | 0 refills | Status: DC
Start: 2023-01-06 — End: 2024-02-15

## 2023-01-06 MED ORDER — SUMATRIPTAN SUCCINATE 100 MG PO TABS
100.0000 mg | ORAL_TABLET | ORAL | 3 refills | Status: AC | PRN
Start: 2023-01-06 — End: ?

## 2023-01-06 MED ORDER — GABAPENTIN 100 MG PO CAPS: ORAL_CAPSULE | ORAL | 3 refills | Status: DC

## 2023-01-06 MED ORDER — ALPRAZOLAM 0.25 MG PO TABS
0.2500 mg | ORAL_TABLET | Freq: Two times a day (BID) | ORAL | 1 refills | Status: AC | PRN
Start: 2023-01-06 — End: ?

## 2023-01-06 MED ORDER — DESVENLAFAXINE SUCCINATE ER 100 MG PO TB24: 100.0000 mg | ORAL_TABLET | Freq: Every day | ORAL | 3 refills | Status: DC

## 2023-01-06 MED ORDER — ARIPIPRAZOLE 10 MG PO TABS: 10.0000 mg | ORAL_TABLET | Freq: Every day | ORAL | 3 refills | Status: DC

## 2023-01-06 NOTE — Progress Notes (Signed)
Subjective:   By signing my name below, I, Stacey Hickman, attest that this documentation has been prepared under the direction and in the presence of Donato Schultz, DO. 01/06/2023   Patient ID: Stacey Hickman, female    DOB: [birth year], 52 y.o.   MRN: [id number]  Chief Complaint  Patient presents with   Annual Exam    Pt states not fasting     HPI Patient is in today for a comprehensive physical exam.   She complains of constant right elbow pain. Her pain radiates to her fingers. Her pain worsens when gripping with her right hand. She has tried ibuprofen and found no relief.  Her depression has worsened due to her mother passing away recently. She continues taking 0.25 mg Xanax to help her depression and anxiety but may need more to help her through grieve.  She is requesting a refill for 0.25 mg xanax, 10 mg Abilify, 300 mg Wellbutrin XL, 100 mg Pristiq, 100 mg Gabapentin, 50 mg Spironolactone.  She denies fever, new moles, congestion, sinus pain, sore throat, chest pain, palpitations, cough, shortness of breath, wheezing, nausea, vomiting, abdominal pain, diarrhea, constipation, dysuria, frequency, hematuria, new muscle pain, new joint pain, or headaches at this time.  Her mother passed away recently during a surgical procedure. Otherwise she has no changes to her family medical history. She is interested in receiving the first dose shingles vaccine.  Colonoscopy was last completed 02/11/2021. Results showed: - Three 1 to 2 mm polyps in the rectum, removed with a cold biopsy forceps. Resected and retrieved. - Diverticulosis in the sigmoid colon and in the descending colon. - Non- bleeding internal hemorrhoids. Repeat in 3-5 years.   Past Medical History:  Diagnosis Date   Depression    no problems   Dizziness    Gestational hypertension 05/27/2014   Headache(784.0)    Insomnia    Left knee injury 03/2011   was in immobilizer   Tendonitis of foot 11/2010   Right foot     Past Surgical History:  Procedure Laterality Date   IUD REMOVAL     OTHER SURGICAL HISTORY     laproscopic removal of IUD, perfed uterus   WISDOM TOOTH EXTRACTION      Family History  Problem Relation Age of Onset   Obesity Mother    Diabetes Mother        Prediabetic   Hypothyroidism Mother    Diabetes Father    Obesity Father    Obesity Sister    Diabetes Brother    Obesity Brother    Diabetes Brother    Hypertension Brother    Hyperlipidemia Brother    Hypothyroidism Brother    Diabetes Paternal Grandfather    Colon polyps Neg Hx    Colon cancer Neg Hx    Esophageal cancer Neg Hx    Stomach cancer Neg Hx    Rectal cancer Neg Hx     Social History   Socioeconomic History   Marital status: Married    Spouse name: Not on file   Number of children: 3   Years of education: Not on file   Highest education level: Not on file  Occupational History   Occupation: PHARMACIST    Comment: [employer]  Tobacco Use   Smoking status: Never   Smokeless tobacco: Never  Vaping Use   Vaping Use: Never used  Substance and Sexual Activity   Alcohol use: Yes    Comment: occ, not with preg  Drug use: No   Sexual activity: Yes    Partners: Male  Other Topics Concern   Not on file  Social History Narrative   Exercising--  30 min 2x a week--- walking   Social Determinants of Health   Financial Resource Strain: Not on file  Food Insecurity: Not on file  Transportation Needs: Not on file  Physical Activity: Not on file  Stress: Not on file  Social Connections: Not on file  Intimate Partner Violence: Not on file    Outpatient Medications Prior to Visit  Medication Sig Dispense Refill   cyclobenzaprine (FLEXERIL) 10 MG tablet Take 1 tablet (10 mg total) by mouth 3 (three) times daily as needed for muscle spasms. 30 tablet 0   fluticasone (FLONASE) 50 MCG/ACT nasal spray Place 2 sprays into both nostrils daily. 16 g 6   ibuprofen (ADVIL,MOTRIN) 600 MG tablet Take 1  tablet (600 mg total) by mouth every 6 (six) hours. 30 tablet 0   levonorgestrel (MIRENA) 20 MCG/24HR IUD 1 each by Intrauterine route once.     meclizine (ANTIVERT) 25 MG tablet Take 1 tablet (25 mg total) by mouth 3 (three) times daily as needed for dizziness. 30 tablet 0   ondansetron (ZOFRAN) 4 MG tablet Take 1 tablet (4 mg total) by mouth every 8 (eight) hours as needed for nausea or vomiting. 20 tablet 0   ALPRAZolam (XANAX) 0.25 MG tablet TAKE 1 TABLET BY MOUTH 2 TIMES DAILY AS NEEDED FOR ANXIETY. 20 tablet 1   ARIPiprazole (ABILIFY) 10 MG tablet Take 1 tablet (10 mg total) by mouth daily. 90 tablet 0   buPROPion (WELLBUTRIN XL) 300 MG 24 hr tablet TAKE 1 TABLET BY MOUTH EVERY DAY 90 tablet 1   desvenlafaxine (PRISTIQ) 100 MG 24 hr tablet Take 1 tablet (100 mg total) by mouth daily. 90 tablet 3   gabapentin (NEURONTIN) 100 MG capsule TAKE 1 CAPSULE (100 MG TOTAL) BY MOUTH THREE TIMES DAILY. 90 capsule 3   spironolactone (ALDACTONE) 50 MG tablet Take 50 mg by mouth daily.     SUMAtriptan (IMITREX) 100 MG tablet Take 1 tablet (100 mg total) by mouth every 2 (two) hours as needed for migraine. Max 2 per 24hrs 10 tablet 3   topiramate (TOPAMAX) 50 MG tablet TAKE 2 TABLETS BY MOUTH TWICE A DAY 360 tablet 1   predniSONE (DELTASONE) 10 MG tablet TAKE 3 TABLETS PO QD FOR 3 DAYS THEN TAKE 2 TABLETS PO QD FOR 3 DAYS THEN TAKE 1 TABLET PO QD FOR 3 DAYS THEN TAKE 1/2 TAB PO QD FOR 3 DAYS (Patient not taking: Reported on 01/06/2023) 20 tablet 0   No facility-administered medications prior to visit.    No Known Allergies  Review of Systems  Constitutional:  Negative for fever.  HENT:  Negative for congestion, sinus pain and sore throat.   Respiratory:  Negative for cough, shortness of breath and wheezing.   Cardiovascular:  Negative for chest pain and palpitations.  Gastrointestinal:  Negative for abdominal pain, constipation, diarrhea, nausea and vomiting.  Genitourinary:  Negative for dysuria,  frequency and hematuria.  Musculoskeletal:        (-)new muscle pain (-)new joint pain  Skin:        (-)New moles  Neurological:  Negative for headaches.  Psychiatric/Behavioral:  Positive for depression. The patient is nervous/anxious.        Objective:    Physical Exam Constitutional:      General: She is not in acute distress.  Appearance: Normal appearance. She is not ill-appearing.  HENT:     Head: Normocephalic and atraumatic.     Right Ear: Tympanic membrane, ear canal and external ear normal.     Left Ear: Tympanic membrane, ear canal and external ear normal.  Eyes:     Extraocular Movements: Extraocular movements intact.     Pupils: Pupils are equal, round, and reactive to light.  Cardiovascular:     Rate and Rhythm: Normal rate and regular rhythm.     Heart sounds: Normal heart sounds. No murmur heard.    No gallop.  Pulmonary:     Effort: Pulmonary effort is normal. No respiratory distress.     Breath sounds: Normal breath sounds. No wheezing or rales.  Abdominal:     General: Bowel sounds are normal. There is no distension.     Palpations: Abdomen is soft.     Tenderness: There is no abdominal tenderness. There is no guarding.  Skin:    General: Skin is warm and dry.  Neurological:     Mental Status: She is alert and oriented to person, place, and time.     Comments: Negative Phalen's test  Psychiatric:        Judgment: Judgment normal.     BP 110/80 (BP Location: Left Arm, Patient Position: Sitting, Cuff Size: Normal)   Pulse 88   Temp 97.8 F (36.6 C) (Oral)   Resp 18   Ht  (1.6 m)   Wt 181 lb 6.4 oz (82.3 kg)   SpO2 97%   BMI 32.13 kg/m  Wt Readings from Last 3 Encounters:  01/06/23 181 lb 6.4 oz (82.3 kg)  06/11/22 177 lb (80.3 kg)  01/22/22 183 lb 9.6 oz (83.3 kg)       Assessment & Plan:  Preventative health care Assessment & Plan: Ghm utd Check labs  See AVS Health Maintenance  Topic Date Due   Zoster Vaccines- Shingrix  (1 of 2) Never done   MAMMOGRAM  03/12/2014   PAP SMEAR-Modifier  01/13/2021   COVID-19 Vaccine (6 - 2023-24 season) 05/14/2022   INFLUENZA VACCINE  04/14/2023   DTaP/Tdap/Td (2 - Td or Tdap) 05/28/2024   COLONOSCOPY (Pts 45-56yrs Insurance coverage will need to be confirmed)  02/12/2031   Hepatitis C Screening  Completed   HIV Screening  Completed   HPV VACCINES  Aged Out     Orders: -     CBC with Differential/Platelet; Future -     Comprehensive metabolic panel; Future -     Lipid panel; Future -     TSH; Future  High risk medication use -     Drug Monitoring Panel [phone] , Urine  Depression with anxiety -     ALPRAZolam; Take 1 tablet (0.25 mg total) by mouth 2 (two) times daily as needed for anxiety.  Dispense: 20 tablet; Refill: 1 -     ARIPiprazole; Take 1 tablet (10 mg total) by mouth daily.  Dispense: 90 tablet; Refill: 3 -     Desvenlafaxine Succinate ER; Take 1 tablet (100 mg total) by mouth daily.  Dispense: 90 tablet; Refill: 3 -     CBC with Differential/Platelet; Future -     Lipid panel; Future -     TSH; Future  Obesity (BMI 30.0-34.9)  Morbid obesity -     buPROPion HCl ER (XL); Take 1 tablet (300 mg total) by mouth daily.  Dispense: 90 tablet; Refill: 3  Shingles of eyelid -  Gabapentin; TAKE 1 CAPSULE (100 MG TOTAL) BY MOUTH THREE TIMES DAILY.  Dispense: 90 capsule; Refill: 3  Other migraine without status migrainosus, not intractable -     SUMAtriptan Succinate; Take 1 tablet (100 mg total) by mouth every 2 (two) hours as needed for migraine. Max 2 per 24hrs  Dispense: 10 tablet; Refill: 3 -     Topiramate; Take 2 tablets (100 mg total) by mouth 2 (two) times daily.  Dispense: 360 tablet; Refill: 3  Chronic pain of both knees -     Gabapentin; TAKE 1 CAPSULE (100 MG TOTAL) BY MOUTH THREE TIMES DAILY.  Dispense: 90 capsule; Refill: 3  Rosacea -     Spironolactone; Take 1 tablet (50 mg total) by mouth daily.  Dispense: 90 tablet; Refill:  1  Entrapment of right ulnar nerve -     predniSONE; TAKE 3 TABLETS PO QD FOR 3 DAYS THEN TAKE 2 TABLETS PO QD FOR 3 DAYS THEN TAKE 1 TABLET PO QD FOR 3 DAYS THEN TAKE 1/2 TAB PO QD FOR 3 DAYS  Dispense: 20 tablet; Refill: 0 -     Ambulatory referral to Orthopedic Surgery  Need for shingles vaccine -     Varicella-zoster vaccine IM  Depression, major, single episode, moderate (HCC) Assessment & Plan: Lexapro daily and con't wellbutrin and abilify Stable    Hyperlipidemia, unspecified hyperlipidemia type Assessment & Plan: Encourage heart healthy diet such as MIND or DASH diet, increase exercise, avoid trans fats, simple carbohydrates and processed foods, consider a krill or fish or flaxseed oil cap daily.       IDonato Schultz, DO, personally preformed the services described in this documentation.  All medical record entries made by the scribe were at my direction and in my presence.  I have reviewed the chart and discharge instructions (if applicable) and agree that the record reflects my personal performance and is accurate and complete. 01/06/2023   I,Stacey Hickman,acting as a scribe for Donato Schultz, DO.,have documented all relevant documentation on the behalf of Donato Schultz, DO,as directed by  Donato Schultz, DO while in the presence of Donato Schultz, DO.   Donato Schultz, DO

## 2023-01-06 NOTE — Assessment & Plan Note (Signed)
Lexapro daily and con't wellbutrin and abilify Stable

## 2023-01-06 NOTE — Assessment & Plan Note (Signed)
Encourage heart healthy diet such as MIND or DASH diet, increase exercise, avoid trans fats, simple carbohydrates and processed foods, consider a krill or fish or flaxseed oil cap daily.  °

## 2023-01-06 NOTE — Patient Instructions (Signed)
Preventive Care 40-52 Years Old, Female Preventive care refers to lifestyle choices and visits with your health care provider that can promote health and wellness. Preventive care visits are also called wellness exams. What can I expect for my preventive care visit? Counseling Your health care provider may ask you questions about your: Medical history, including: Past medical problems. Family medical history. Pregnancy history. Current health, including: Menstrual cycle. Method of birth control. Emotional well-being. Home life and relationship well-being. Sexual activity and sexual health. Lifestyle, including: Alcohol, nicotine or tobacco, and drug use. Access to firearms. Diet, exercise, and sleep habits. Work and work environment. Sunscreen use. Safety issues such as seatbelt and bike helmet use. Physical exam Your health care provider will check your: Height and weight. These may be used to calculate your BMI (body mass index). BMI is a measurement that tells if you are at a healthy weight. Waist circumference. This measures the distance around your waistline. This measurement also tells if you are at a healthy weight and may help predict your risk of certain diseases, such as type 2 diabetes and high blood pressure. Heart rate and blood pressure. Body temperature. Skin for abnormal spots. What immunizations do I need?  Vaccines are usually given at various ages, according to a schedule. Your health care provider will recommend vaccines for you based on your age, medical history, and lifestyle or other factors, such as travel or where you work. What tests do I need? Screening Your health care provider may recommend screening tests for certain conditions. This may include: Lipid and cholesterol levels. Diabetes screening. This is done by checking your blood sugar (glucose) after you have not eaten for a while (fasting). Pelvic exam and Pap test. Hepatitis B test. Hepatitis C  test. HIV (human immunodeficiency virus) test. STI (sexually transmitted infection) testing, if you are at risk. Lung cancer screening. Colorectal cancer screening. Mammogram. Talk with your health care provider about when you should start having regular mammograms. This may depend on whether you have a family history of breast cancer. BRCA-related cancer screening. This may be done if you have a family history of breast, ovarian, tubal, or peritoneal cancers. Bone density scan. This is done to screen for osteoporosis. Talk with your health care provider about your test results, treatment options, and if necessary, the need for more tests. Follow these instructions at home: Eating and drinking  Eat a diet that includes fresh fruits and vegetables, whole grains, lean protein, and low-fat dairy products. Take vitamin and mineral supplements as recommended by your health care provider. Do not drink alcohol if: Your health care provider tells you not to drink. You are pregnant, may be pregnant, or are planning to become pregnant. If you drink alcohol: Limit how much you have to 0-1 drink a day. Know how much alcohol is in your drink. In the [place] one drink equals one 12 oz bottle of beer (355 mL), one 5 oz glass of wine (148 mL), or one 1 oz glass of hard liquor (44 mL). Lifestyle Brush your teeth every morning and night with fluoride toothpaste. Floss one time each day. Exercise for at least 30 minutes 5 or more days each week. Do not use any products that contain nicotine or tobacco. These products include cigarettes, chewing tobacco, and vaping devices, such as e-cigarettes. If you need help quitting, ask your health care provider. Do not use drugs. If you are sexually active, practice safe sex. Use a condom or other form of protection to   prevent STIs. If you do not wish to become pregnant, use a form of birth control. If you plan to become pregnant, see your health care provider for a  prepregnancy visit. Take aspirin only as told by your health care provider. Make sure that you understand how much to take and what form to take. Work with your health care provider to find out whether it is safe and beneficial for you to take aspirin daily. Find healthy ways to manage stress, such as: Meditation, yoga, or listening to music. Journaling. Talking to a trusted person. Spending time with friends and family. Minimize exposure to UV radiation to reduce your risk of skin cancer. Safety Always wear your seat belt while driving or riding in a vehicle. Do not drive: If you have been drinking alcohol. Do not ride with someone who has been drinking. When you are tired or distracted. While texting. If you have been using any mind-altering substances or drugs. Wear a helmet and other protective equipment during sports activities. If you have firearms in your house, make sure you follow all gun safety procedures. Seek help if you have been physically or sexually abused. What's next? Visit your health care provider once a year for an annual wellness visit. Ask your health care provider how often you should have your eyes and teeth checked. Stay up to date on all vaccines. This information is not intended to replace advice given to you by your health care provider. Make sure you discuss any questions you have with your health care provider. Document Revised: 02/25/2021 Document Reviewed: 02/25/2021 Elsevier Patient Education  2023 [employer]  

## 2023-01-06 NOTE — Assessment & Plan Note (Signed)
Ghm utd Check labs  See AVS Health Maintenance  Topic Date Due   Zoster Vaccines- Shingrix (1 of 2) Never done   MAMMOGRAM  03/12/2014   PAP SMEAR-Modifier  01/13/2021   COVID-19 Vaccine (6 - 2023-24 season) 05/14/2022   INFLUENZA VACCINE  04/14/2023   DTaP/Tdap/Td (2 - Td or Tdap) 05/28/2024   COLONOSCOPY (Pts 45-28yrs Insurance coverage will need to be confirmed)  02/12/2031   Hepatitis C Screening  Completed   HIV Screening  Completed   HPV VACCINES  Aged Out

## 2023-01-09 LAB — DRUG MONITORING PANEL 376104, URINE

## 2023-01-09 LAB — DM TEMPLATE

## 2023-01-10 ENCOUNTER — Other Ambulatory Visit: Payer: Self-pay | Admitting: Family Medicine

## 2023-01-10 ENCOUNTER — Other Ambulatory Visit (INDEPENDENT_AMBULATORY_CARE_PROVIDER_SITE_OTHER): Payer: 59

## 2023-01-10 DIAGNOSIS — Z0000 Encounter for general adult medical examination without abnormal findings: Secondary | ICD-10-CM | POA: Diagnosis not present

## 2023-01-10 DIAGNOSIS — E039 Hypothyroidism, unspecified: Secondary | ICD-10-CM

## 2023-01-10 DIAGNOSIS — F418 Other specified anxiety disorders: Secondary | ICD-10-CM

## 2023-01-10 LAB — TSH: TSH: 5.67 u[IU]/mL — ABNORMAL HIGH (ref 0.35–5.50)

## 2023-01-10 LAB — CBC WITH DIFFERENTIAL/PLATELET
Basophils Absolute: 0.1 10*3/uL (ref 0.0–0.1)
Basophils Relative: 1.5 % (ref 0.0–3.0)
Eosinophils Absolute: 0.1 10*3/uL (ref 0.0–0.7)
Eosinophils Relative: 1.3 % (ref 0.0–5.0)
HCT: 43.5 % (ref 36.0–46.0)
Hemoglobin: 14.6 g/dL (ref 12.0–15.0)
Lymphocytes Relative: 28.8 % (ref 12.0–46.0)
Lymphs Abs: 2.3 10*3/uL (ref 0.7–4.0)
MCHC: 33.6 g/dL (ref 30.0–36.0)
MCV: 92.2 fl (ref 78.0–100.0)
Monocytes Absolute: 0.4 10*3/uL (ref 0.1–1.0)
Monocytes Relative: 5.4 % (ref 3.0–12.0)
Neutro Abs: 5 10*3/uL (ref 1.4–7.7)
Neutrophils Relative %: 63 % (ref 43.0–77.0)
Platelets: 231 10*3/uL (ref 150.0–400.0)
RBC: 4.72 Mil/uL (ref 3.87–5.11)
RDW: 12.3 % (ref 11.5–15.5)
WBC: 8 10*3/uL (ref 4.0–10.5)

## 2023-01-10 LAB — COMPREHENSIVE METABOLIC PANEL
ALT: 16 U/L (ref 0–35)
AST: 12 U/L (ref 0–37)
Albumin: 3.8 g/dL (ref 3.5–5.2)
Alkaline Phosphatase: 47 U/L (ref 39–117)
BUN: 16 mg/dL (ref 6–23)
CO2: 24 mEq/L (ref 19–32)
Calcium: 8.4 mg/dL (ref 8.4–10.5)
Chloride: 111 mEq/L (ref 96–112)
Creatinine, Ser: 1.08 mg/dL (ref 0.40–1.20)
GFR: 59.23 mL/min — ABNORMAL LOW (ref >60.00)
Glucose, Bld: 96 mg/dL (ref 70–99)
Potassium: 4.1 mEq/L (ref 3.5–5.1)
Sodium: 143 mEq/L (ref 135–145)
Total Bilirubin: 0.3 mg/dL (ref 0.2–1.2)
Total Protein: 6.5 g/dL (ref 6.0–8.3)

## 2023-01-10 LAB — LIPID PANEL
Cholesterol: 128 mg/dL (ref 0–200)
HDL: 33.7 mg/dL — ABNORMAL LOW (ref >39.00)
LDL Cholesterol: 74 mg/dL (ref 0–99)
NonHDL: 94.36
Total CHOL/HDL Ratio: 4
Triglycerides: 101 mg/dL (ref 0.0–149.0)
VLDL: 20.2 mg/dL (ref 0.0–40.0)

## 2023-03-09 ENCOUNTER — Other Ambulatory Visit (INDEPENDENT_AMBULATORY_CARE_PROVIDER_SITE_OTHER): Payer: 59

## 2023-03-09 ENCOUNTER — Ambulatory Visit (INDEPENDENT_AMBULATORY_CARE_PROVIDER_SITE_OTHER): Payer: 59

## 2023-03-09 DIAGNOSIS — E039 Hypothyroidism, unspecified: Secondary | ICD-10-CM | POA: Diagnosis not present

## 2023-03-09 DIAGNOSIS — Z23 Encounter for immunization: Secondary | ICD-10-CM

## 2023-03-10 LAB — THYROID PANEL WITH TSH
Free Thyroxine Index: 1.6 (ref 1.4–3.8)
T3 Uptake: 28 % (ref 22–35)
T4, Total: 5.7 ug/dL (ref 5.1–11.9)
TSH: 5.06 mIU/L — ABNORMAL HIGH

## 2023-03-20 ENCOUNTER — Other Ambulatory Visit: Payer: Self-pay | Admitting: Family Medicine

## 2023-03-20 DIAGNOSIS — E039 Hypothyroidism, unspecified: Secondary | ICD-10-CM

## 2023-03-20 MED ORDER — LEVOTHYROXINE SODIUM 50 MCG PO TABS
50.0000 ug | ORAL_TABLET | Freq: Every day | ORAL | 3 refills | Status: DC
Start: 2023-03-20 — End: 2023-12-09

## 2023-03-22 ENCOUNTER — Other Ambulatory Visit: Payer: Self-pay

## 2023-03-22 DIAGNOSIS — E039 Hypothyroidism, unspecified: Secondary | ICD-10-CM

## 2023-05-23 ENCOUNTER — Other Ambulatory Visit (INDEPENDENT_AMBULATORY_CARE_PROVIDER_SITE_OTHER): Payer: 59

## 2023-05-23 DIAGNOSIS — E039 Hypothyroidism, unspecified: Secondary | ICD-10-CM | POA: Diagnosis not present

## 2023-05-24 LAB — THYROID PANEL WITH TSH
Free Thyroxine Index: 1.7 (ref 1.4–3.8)
T3 Uptake: 27 % (ref 22–35)
T4, Total: 6.4 ug/dL (ref 5.1–11.9)
TSH: 2.68 m[IU]/L

## 2023-06-15 ENCOUNTER — Other Ambulatory Visit (HOSPITAL_COMMUNITY): Payer: Self-pay

## 2023-06-25 ENCOUNTER — Other Ambulatory Visit: Payer: Self-pay | Admitting: Family Medicine

## 2023-06-25 DIAGNOSIS — F418 Other specified anxiety disorders: Secondary | ICD-10-CM

## 2023-07-18 ENCOUNTER — Other Ambulatory Visit: Payer: Self-pay | Admitting: Family Medicine

## 2023-07-18 DIAGNOSIS — F418 Other specified anxiety disorders: Secondary | ICD-10-CM

## 2023-07-27 DIAGNOSIS — Z01419 Encounter for gynecological examination (general) (routine) without abnormal findings: Secondary | ICD-10-CM | POA: Diagnosis not present

## 2023-07-27 DIAGNOSIS — Z1231 Encounter for screening mammogram for malignant neoplasm of breast: Secondary | ICD-10-CM | POA: Diagnosis not present

## 2023-07-27 DIAGNOSIS — Z1339 Encounter for screening examination for other mental health and behavioral disorders: Secondary | ICD-10-CM | POA: Diagnosis not present

## 2023-07-29 LAB — HM MAMMOGRAPHY

## 2023-08-16 DIAGNOSIS — D485 Neoplasm of uncertain behavior of skin: Secondary | ICD-10-CM | POA: Diagnosis not present

## 2023-08-16 DIAGNOSIS — L570 Actinic keratosis: Secondary | ICD-10-CM | POA: Diagnosis not present

## 2023-08-16 DIAGNOSIS — L821 Other seborrheic keratosis: Secondary | ICD-10-CM | POA: Diagnosis not present

## 2023-08-16 DIAGNOSIS — D2262 Melanocytic nevi of left upper limb, including shoulder: Secondary | ICD-10-CM | POA: Diagnosis not present

## 2023-08-16 DIAGNOSIS — D225 Melanocytic nevi of trunk: Secondary | ICD-10-CM | POA: Diagnosis not present

## 2023-08-16 DIAGNOSIS — L905 Scar conditions and fibrosis of skin: Secondary | ICD-10-CM | POA: Diagnosis not present

## 2023-08-16 DIAGNOSIS — L578 Other skin changes due to chronic exposure to nonionizing radiation: Secondary | ICD-10-CM | POA: Diagnosis not present

## 2023-08-17 ENCOUNTER — Other Ambulatory Visit: Payer: Self-pay | Admitting: Family Medicine

## 2023-08-17 DIAGNOSIS — L719 Rosacea, unspecified: Secondary | ICD-10-CM

## 2023-10-13 DIAGNOSIS — D485 Neoplasm of uncertain behavior of skin: Secondary | ICD-10-CM | POA: Diagnosis not present

## 2023-10-13 DIAGNOSIS — L988 Other specified disorders of the skin and subcutaneous tissue: Secondary | ICD-10-CM | POA: Diagnosis not present

## 2023-10-16 ENCOUNTER — Other Ambulatory Visit: Payer: Self-pay | Admitting: Family Medicine

## 2023-10-16 DIAGNOSIS — L719 Rosacea, unspecified: Secondary | ICD-10-CM

## 2023-11-08 ENCOUNTER — Other Ambulatory Visit: Payer: Self-pay | Admitting: Family Medicine

## 2023-11-08 DIAGNOSIS — L719 Rosacea, unspecified: Secondary | ICD-10-CM

## 2023-12-08 ENCOUNTER — Other Ambulatory Visit: Payer: Self-pay | Admitting: Family Medicine

## 2023-12-08 DIAGNOSIS — E039 Hypothyroidism, unspecified: Secondary | ICD-10-CM

## 2023-12-15 ENCOUNTER — Other Ambulatory Visit: Payer: Self-pay | Admitting: Family Medicine

## 2023-12-15 DIAGNOSIS — G43809 Other migraine, not intractable, without status migrainosus: Secondary | ICD-10-CM

## 2023-12-15 DIAGNOSIS — F418 Other specified anxiety disorders: Secondary | ICD-10-CM

## 2023-12-21 ENCOUNTER — Encounter: Payer: Self-pay | Admitting: Family Medicine

## 2023-12-21 DIAGNOSIS — E039 Hypothyroidism, unspecified: Secondary | ICD-10-CM

## 2023-12-22 MED ORDER — LEVOTHYROXINE SODIUM 50 MCG PO TABS: 50.0000 ug | ORAL_TABLET | Freq: Every day | ORAL | 0 refills | Status: DC

## 2024-01-15 ENCOUNTER — Other Ambulatory Visit: Payer: Self-pay | Admitting: Family Medicine

## 2024-01-15 DIAGNOSIS — F418 Other specified anxiety disorders: Secondary | ICD-10-CM

## 2024-02-08 ENCOUNTER — Other Ambulatory Visit: Payer: Self-pay | Admitting: Family Medicine

## 2024-02-08 DIAGNOSIS — F418 Other specified anxiety disorders: Secondary | ICD-10-CM

## 2024-02-10 ENCOUNTER — Ambulatory Visit: Admitting: Family Medicine

## 2024-02-10 ENCOUNTER — Encounter: Payer: Self-pay | Admitting: Family Medicine

## 2024-02-10 VITALS — BP 108/80 | HR 80 | Temp 97.9°F | Resp 16 | Ht 63.0 in | Wt 187.0 lb

## 2024-02-10 DIAGNOSIS — F418 Other specified anxiety disorders: Secondary | ICD-10-CM | POA: Diagnosis not present

## 2024-02-10 DIAGNOSIS — E785 Hyperlipidemia, unspecified: Secondary | ICD-10-CM

## 2024-02-10 DIAGNOSIS — Z23 Encounter for immunization: Secondary | ICD-10-CM | POA: Diagnosis not present

## 2024-02-10 DIAGNOSIS — E66811 Obesity, class 1: Secondary | ICD-10-CM

## 2024-02-10 DIAGNOSIS — E039 Hypothyroidism, unspecified: Secondary | ICD-10-CM | POA: Diagnosis not present

## 2024-02-10 DIAGNOSIS — Z0000 Encounter for general adult medical examination without abnormal findings: Secondary | ICD-10-CM

## 2024-02-10 LAB — LIPID PANEL
Cholesterol: 178 mg/dL (ref 0–200)
HDL: 43.9 mg/dL (ref >39.00)
LDL Cholesterol: 95 mg/dL (ref 0–99)
NonHDL: 134.43
Total CHOL/HDL Ratio: 4
Triglycerides: 196 mg/dL — ABNORMAL HIGH (ref 0.0–149.0)
VLDL: 39.2 mg/dL (ref 0.0–40.0)

## 2024-02-10 LAB — COMPREHENSIVE METABOLIC PANEL WITH GFR
ALT: 33 U/L (ref 0–35)
AST: 26 U/L (ref 0–37)
Albumin: 4.8 g/dL (ref 3.5–5.2)
Alkaline Phosphatase: 65 U/L (ref 39–117)
BUN: 16 mg/dL (ref 6–23)
CO2: 25 meq/L (ref 19–32)
Calcium: 9.3 mg/dL (ref 8.4–10.5)
Chloride: 105 meq/L (ref 96–112)
Creatinine, Ser: 1.1 mg/dL (ref 0.40–1.20)
GFR: 57.5 mL/min — ABNORMAL LOW (ref >60.00)
Glucose, Bld: 89 mg/dL (ref 70–99)
Potassium: 4.5 meq/L (ref 3.5–5.1)
Sodium: 140 meq/L (ref 135–145)
Total Bilirubin: 0.6 mg/dL (ref 0.2–1.2)
Total Protein: 7.9 g/dL (ref 6.0–8.3)

## 2024-02-10 LAB — CBC WITH DIFFERENTIAL/PLATELET
Basophils Absolute: 0.1 10*3/uL (ref 0.0–0.1)
Basophils Relative: 0.8 % (ref 0.0–3.0)
Eosinophils Absolute: 0.1 10*3/uL (ref 0.0–0.7)
Eosinophils Relative: 1.2 % (ref 0.0–5.0)
HCT: 48.5 % — ABNORMAL HIGH (ref 36.0–46.0)
Hemoglobin: 16.2 g/dL — ABNORMAL HIGH (ref 12.0–15.0)
Lymphocytes Relative: 26.7 % (ref 12.0–46.0)
Lymphs Abs: 1.8 10*3/uL (ref 0.7–4.0)
MCHC: 33.3 g/dL (ref 30.0–36.0)
MCV: 90.4 fl (ref 78.0–100.0)
Monocytes Absolute: 0.3 10*3/uL (ref 0.1–1.0)
Monocytes Relative: 4.2 % (ref 3.0–12.0)
Neutro Abs: 4.5 10*3/uL (ref 1.4–7.7)
Neutrophils Relative %: 67.1 % (ref 43.0–77.0)
Platelets: 193 10*3/uL (ref 150.0–400.0)
RBC: 5.36 Mil/uL — ABNORMAL HIGH (ref 3.87–5.11)
RDW: 12.5 % (ref 11.5–15.5)
WBC: 6.7 10*3/uL (ref 4.0–10.5)

## 2024-02-10 LAB — TSH: TSH: 3.6 u[IU]/mL (ref 0.35–5.50)

## 2024-02-10 MED ORDER — ARIPIPRAZOLE 15 MG PO TABS
15.0000 mg | ORAL_TABLET | Freq: Every day | ORAL | 1 refills | Status: DC
Start: 2024-02-10 — End: 2024-08-11

## 2024-02-10 NOTE — Assessment & Plan Note (Signed)
 Ghm utd Check labs  See AVS Health Maintenance  Topic Date Due   Cervical Cancer Screening (HPV/Pap Cotest)  Never done   MAMMOGRAM  03/12/2014   COVID-19 Vaccine (6 - 2024-25 season) 05/15/2023   INFLUENZA VACCINE  04/13/2024   DTaP/Tdap/Td (2 - Td or Tdap) 05/28/2024   Colonoscopy  02/12/2031   Hepatitis C Screening  Completed   HIV Screening  Completed   Zoster Vaccines- Shingrix   Completed   Pneumococcal Vaccine 36-53 Years old  Aged Out   HPV VACCINES  Aged Out   Meningococcal B Vaccine  Aged Out

## 2024-02-10 NOTE — Progress Notes (Signed)
 Established Patient Office Visit  Subjective   Patient ID: Stacey Hickman, female    DOB: March 31, 1971  Age: 53 y.o. MRN: [id number]  Chief Complaint  Patient presents with   Annual Exam    Pt states fasting     HPI Discussed the use of AI scribe software for clinical note transcription with the patient, who gave verbal consent to proceed.  History of Present Illness Stacey Hickman is a 53 year old female who presents for a routine follow-up visit.  She had a mammogram on either October 31st or November 1st of the previous year. Her gynecologist examined her but did not perform a Pap smear, as she is not due for one for another three to five years. She has not received the pneumonia vaccine, which is now recommended for individuals aged 48 and over.  She is currently taking aripiprazole  10 mg. She is not currently exercising and wants to resume physical activity. She experiences ongoing emotional difficulty related to her mother's passing last year, stating 'it feels like my spark is gone.'  Her mother passed away last year on Apr 16, 2025due to complications from ARDS following a stomach procedure.  Her son, Stacey Hickman, who has Down syndrome, is transitioning from pediatric to adult care as he turns 19 soon. He is currently on [place] for behavioral issues, Synthroid , fluoxetine , levothyroxine , melatonin, clonidine, and a multivitamin. His thyroid  is managed through [facility], and he has been doing well in cardiology check-ups.  She sees a dermatologist annually and had a spot removed, resulting in a scar. She is not currently concerned about any moles or skin issues.   Patient Active Problem List   Diagnosis Date Noted   Preventative health care 01/06/2023   Chronic left shoulder pain 06/11/2022   Shingles of eyelid 04/27/2021   Depression with anxiety 05/25/2020   Right hand pain 05/31/2016   Wrist injury 10/06/2015   Hyperlipidemia 03/03/2015   Dizzy 03/03/2015    Thrombocytopenia, unspecified (HCC) 05/29/2014   Hx of preterm delivery, currently pregnant 05/27/2014   Gestational hypertension 05/27/2014   Vaginal delivery 05/27/2014   Trisomy 21, child of prior pregnancy, currently pregnant 10/08/2013   Elderly multigravida with antepartum condition or complication 10/08/2013   Rh negative state in antepartum period 10/08/2013   Obesity (BMI 30.0-34.9) 05/28/2013   Migraine 05/28/2013   Depression, major, single episode, moderate (HCC) 08/21/2008   Past Medical History:  Diagnosis Date   Depression    no problems   Dizziness    Gestational hypertension 05/27/2014   Headache(784.0)    Insomnia    Left knee injury 03/2011   was in immobilizer   Tendonitis of foot 11/2010   Right foot   Past Surgical History:  Procedure Laterality Date   IUD REMOVAL     OTHER SURGICAL HISTORY     laproscopic removal of IUD, perfed uterus   WISDOM TOOTH EXTRACTION     Social History   Tobacco Use   Smoking status: Never   Smokeless tobacco: Never  Vaping Use   Vaping status: Never Used  Substance Use Topics   Alcohol use: Yes    Comment: occ, not with preg   Drug use: No   Social History   Socioeconomic History   Marital status: Married    Spouse name: Not on file   Number of children: 3   Years of education: Not on file   Highest education level: Not on file  Occupational History  Occupation: PHARMACIST    Comment: [employer]  Tobacco Use   Smoking status: Never   Smokeless tobacco: Never  Vaping Use   Vaping status: Never Used  Substance and Sexual Activity   Alcohol use: Yes    Comment: occ, not with preg   Drug use: No   Sexual activity: Yes    Partners: Male  Other Topics Concern   Not on file  Social History Narrative   Exercising--  30 min 2x a week--- walking   Social Drivers of Corporate investment banker Strain: Not on file  Food Insecurity: Not on file  Transportation Needs: Not on file  Physical Activity: Not on  file  Stress: Not on file  Social Connections: Not on file  Intimate Partner Violence: Not on file   Family Status  Relation Name Status   Mother  Deceased at age 32   Father  Alive   Sister  Alive   Brother  Alive   Brother  Alive   Brother  Alive   Brother  Alive   Brother  Alive   PGF  Deceased   Neg Hx  (Not Specified)  No partnership data on file   Family History  Problem Relation Age of Onset   Obesity Mother    Diabetes Mother        Prediabetic   Hypothyroidism Mother    Diabetes Father    Obesity Father    Obesity Sister    Diabetes Brother    Obesity Brother    Diabetes Brother    Hypertension Brother    Hyperlipidemia Brother    Hypothyroidism Brother    Diabetes Paternal Grandfather    Colon polyps Neg Hx    Colon cancer Neg Hx    Esophageal cancer Neg Hx    Stomach cancer Neg Hx    Rectal cancer Neg Hx    No Known Allergies    Review of Systems  Constitutional:  Negative for fever and malaise/fatigue.  HENT:  Negative for congestion.   Eyes:  Negative for blurred vision.  Respiratory:  Negative for cough and shortness of breath.   Cardiovascular:  Negative for chest pain, palpitations and leg swelling.  Gastrointestinal:  Negative for abdominal pain, blood in stool, nausea and vomiting.  Genitourinary:  Negative for dysuria and frequency.  Musculoskeletal:  Negative for back pain and falls.  Skin:  Negative for rash.  Neurological:  Negative for dizziness, loss of consciousness and headaches.  Endo/Heme/Allergies:  Negative for environmental allergies.  Psychiatric/Behavioral:  Negative for depression. The patient is not nervous/anxious.       Objective:     BP 108/80 (BP Location: Left Arm, Patient Position: Sitting, Cuff Size: Large)   Pulse 80   Temp 97.9 F (36.6 C) (Oral)   Resp 16   Ht 5\' 3"  (1.6 m)   Wt 187 lb (84.8 kg)   SpO2 98%   BMI 33.13 kg/m  BP Readings from Last 3 Encounters:  02/10/24 108/80  01/06/23 110/80   06/11/22 98/70   Wt Readings from Last 3 Encounters:  02/10/24 187 lb (84.8 kg)  01/06/23 181 lb 6.4 oz (82.3 kg)  06/11/22 177 lb (80.3 kg)   SpO2 Readings from Last 3 Encounters:  02/10/24 98%  01/06/23 97%  06/11/22 96%      Physical Exam Vitals and nursing note reviewed.  Constitutional:      General: She is not in acute distress.    Appearance: Normal appearance. She  is well-developed.  HENT:     Head: Normocephalic and atraumatic.     Right Ear: Tympanic membrane, ear canal and external ear normal. There is no impacted cerumen.     Left Ear: Tympanic membrane, ear canal and external ear normal. There is no impacted cerumen.     Nose: Nose normal.     Mouth/Throat:     Mouth: Mucous membranes are moist.     Pharynx: Oropharynx is clear. No oropharyngeal exudate or posterior oropharyngeal erythema.  Eyes:     General: No scleral icterus.       Right eye: No discharge.        Left eye: No discharge.     Conjunctiva/sclera: Conjunctivae normal.     Pupils: Pupils are equal, round, and reactive to light.  Neck:     Thyroid : No thyromegaly or thyroid  tenderness.     Vascular: No JVD.  Cardiovascular:     Rate and Rhythm: Normal rate and regular rhythm.     Heart sounds: Normal heart sounds. No murmur heard. Pulmonary:     Effort: Pulmonary effort is normal. No respiratory distress.     Breath sounds: Normal breath sounds.  Abdominal:     General: Bowel sounds are normal. There is no distension.     Palpations: Abdomen is soft. There is no mass.     Tenderness: There is no abdominal tenderness. There is no guarding or rebound.  Musculoskeletal:        General: Normal range of motion.     Cervical back: Normal range of motion and neck supple.     Right lower leg: No edema.     Left lower leg: No edema.  Lymphadenopathy:     Cervical: No cervical adenopathy.  Skin:    General: Skin is warm and dry.     Findings: No erythema or rash.  Neurological:     Mental  Status: She is alert and oriented to person, place, and time.     Cranial Nerves: No cranial nerve deficit.     Deep Tendon Reflexes: Reflexes are normal and symmetric.  Psychiatric:        Mood and Affect: Mood is anxious and depressed. Affect is tearful.        Behavior: Behavior normal.        Thought Content: Thought content normal.        Judgment: Judgment normal.      No results found for any visits on 02/10/24.  Last CBC Lab Results  Component Value Date   WBC 8.0 01/10/2023   HGB 14.6 01/10/2023   HCT 43.5 01/10/2023   MCV 92.2 01/10/2023   MCH 28.8 05/28/2014   RDW 12.3 01/10/2023   PLT 231.0 01/10/2023   Last metabolic panel Lab Results  Component Value Date   GLUCOSE 96 01/10/2023   NA 143 01/10/2023   K 4.1 01/10/2023   CL 111 01/10/2023   CO2 24 01/10/2023   BUN 16 01/10/2023   CREATININE 1.08 01/10/2023   GFR 59.23 (L) 01/10/2023   CALCIUM 8.4 01/10/2023   PROT 6.5 01/10/2023   ALBUMIN 3.8 01/10/2023   BILITOT 0.3 01/10/2023   ALKPHOS 47 01/10/2023   AST 12 01/10/2023   ALT 16 01/10/2023   ANIONGAP 14 05/27/2014   Last lipids Lab Results  Component Value Date   CHOL 128 01/10/2023   HDL 33.70 (L) 01/10/2023   LDLCALC 74 01/10/2023   TRIG 101.0 01/10/2023   CHOLHDL 4 01/10/2023  Last hemoglobin A1c Lab Results  Component Value Date   HGBA1C 4.9 11/20/2020   Last thyroid  functions Lab Results  Component Value Date   TSH 2.68 05/23/2023   T4TOTAL 6.4 05/23/2023   Last vitamin D  Lab Results  Component Value Date   VD25OH 41.88 11/20/2020   Last vitamin B12 and Folate Lab Results  Component Value Date   VITAMINB12 402 09/04/2014   FOLATE 12.2 08/20/2008      The ASCVD Risk score (Arnett DK, et al., 2019) failed to calculate for the following reasons:   The valid total cholesterol range is 130 to 320 mg/dL    Assessment & Plan:   Problem List Items Addressed This Visit       Unprioritized   Depression with anxiety -  Primary   Relevant Medications   ARIPiprazole  (ABILIFY ) 15 MG tablet   Obesity (BMI 30.0-34.9)   Hyperlipidemia   Relevant Orders   CBC with Differential/Platelet   Comprehensive metabolic panel with GFR   Lipid panel   Preventative health care   Ghm utd Check labs  See AVS Health Maintenance  Topic Date Due   Cervical Cancer Screening (HPV/Pap Cotest)  Never done   MAMMOGRAM  03/12/2014   COVID-19 Vaccine (6 - 2024-25 season) 05/15/2023   INFLUENZA VACCINE  04/13/2024   DTaP/Tdap/Td (2 - Td or Tdap) 05/28/2024   Colonoscopy  02/12/2031   Hepatitis C Screening  Completed   HIV Screening  Completed   Zoster Vaccines- Shingrix   Completed   Pneumococcal Vaccine 64-56 Years old  Aged Out   HPV VACCINES  Aged Out   Meningococcal B Vaccine  Aged Out         Other Visit Diagnoses       Need for pneumococcal 20-valent conjugate vaccination       Relevant Orders   Pneumococcal conjugate vaccine 20-valent (Prevnar 20) (Completed)     Hypothyroidism, unspecified type       Relevant Orders   CBC with Differential/Platelet   TSH     Assessment and Plan Assessment & Plan Depression   Chronic depression has recently worsened due to the anniversary of her mother's death. She is currently taking aripiprazole  10 mg. Increase the dose to 15 mg daily to improve mood. Encourage regular physical activity to enhance mood.  General Health Maintenance   Routine health maintenance is up to date. She had a mammogram in late October or early November, and a Pap smear is not due per guidelines. The pneumonia vaccine, recommended for those 50 and over, has not been administered. Administer the pneumonia vaccine today. Order routine lab work, including TSH, lipid panel, glucose, liver and kidney function tests, and CBC.    No follow-ups on file.    Stacey Lannan R Lowne Chase, DO

## 2024-02-15 ENCOUNTER — Ambulatory Visit: Payer: Self-pay | Admitting: Family Medicine

## 2024-02-15 ENCOUNTER — Other Ambulatory Visit: Payer: Self-pay | Admitting: Family Medicine

## 2024-02-15 DIAGNOSIS — D582 Other hemoglobinopathies: Secondary | ICD-10-CM

## 2024-02-27 ENCOUNTER — Other Ambulatory Visit (INDEPENDENT_AMBULATORY_CARE_PROVIDER_SITE_OTHER)

## 2024-02-27 DIAGNOSIS — D582 Other hemoglobinopathies: Secondary | ICD-10-CM | POA: Diagnosis not present

## 2024-02-27 NOTE — Addendum Note (Signed)
 Addended by: Marigene Shoulder on: 02/27/2024 08:18 AM   Modules accepted: Orders

## 2024-02-28 LAB — CBC WITH DIFFERENTIAL/PLATELET
Absolute Lymphocytes: 1723 {cells}/uL (ref 850–3900)
Absolute Monocytes: 290 {cells}/uL (ref 200–950)
Basophils Absolute: 52 {cells}/uL (ref 0–200)
Basophils Relative: 0.9 %
Eosinophils Absolute: 70 {cells}/uL (ref 15–500)
Eosinophils Relative: 1.2 %
HCT: 45.8 % — ABNORMAL HIGH (ref 35.0–45.0)
Hemoglobin: 15.1 g/dL (ref 11.7–15.5)
MCH: 30.3 pg (ref 27.0–33.0)
MCHC: 33 g/dL (ref 32.0–36.0)
MCV: 91.8 fL (ref 80.0–100.0)
MPV: 10.5 fL (ref 7.5–12.5)
Monocytes Relative: 5 %
Neutro Abs: 3666 {cells}/uL (ref 1500–7800)
Neutrophils Relative %: 63.2 %
Platelets: 184 10*3/uL (ref 140–400)
RBC: 4.99 10*6/uL (ref 3.80–5.10)
RDW: 11.8 % (ref 11.0–15.0)
Total Lymphocyte: 29.7 %
WBC: 5.8 10*3/uL (ref 3.8–10.8)

## 2024-02-28 LAB — ERYTHROPOIETIN: Erythropoietin: 6.2 m[IU]/mL (ref 2.6–18.5)

## 2024-03-02 ENCOUNTER — Ambulatory Visit: Payer: Self-pay | Admitting: Family Medicine

## 2024-03-07 ENCOUNTER — Other Ambulatory Visit: Payer: Self-pay | Admitting: Family Medicine

## 2024-03-07 DIAGNOSIS — L719 Rosacea, unspecified: Secondary | ICD-10-CM

## 2024-03-07 DIAGNOSIS — E039 Hypothyroidism, unspecified: Secondary | ICD-10-CM

## 2024-03-07 DIAGNOSIS — G8929 Other chronic pain: Secondary | ICD-10-CM

## 2024-03-07 DIAGNOSIS — B0239 Other herpes zoster eye disease: Secondary | ICD-10-CM

## 2024-03-07 DIAGNOSIS — F418 Other specified anxiety disorders: Secondary | ICD-10-CM

## 2024-04-04 ENCOUNTER — Other Ambulatory Visit: Payer: Self-pay | Admitting: Family Medicine

## 2024-04-04 DIAGNOSIS — F418 Other specified anxiety disorders: Secondary | ICD-10-CM

## 2024-04-12 ENCOUNTER — Other Ambulatory Visit: Payer: Self-pay | Admitting: Family Medicine

## 2024-04-12 DIAGNOSIS — G43809 Other migraine, not intractable, without status migrainosus: Secondary | ICD-10-CM

## 2024-05-16 ENCOUNTER — Other Ambulatory Visit: Payer: Self-pay | Admitting: Family Medicine

## 2024-05-16 DIAGNOSIS — G43809 Other migraine, not intractable, without status migrainosus: Secondary | ICD-10-CM

## 2024-06-01 ENCOUNTER — Other Ambulatory Visit: Payer: Self-pay | Admitting: Obstetrics and Gynecology

## 2024-06-01 DIAGNOSIS — Z1231 Encounter for screening mammogram for malignant neoplasm of breast: Secondary | ICD-10-CM

## 2024-06-17 ENCOUNTER — Other Ambulatory Visit: Payer: Self-pay | Admitting: Family Medicine

## 2024-06-17 DIAGNOSIS — G43809 Other migraine, not intractable, without status migrainosus: Secondary | ICD-10-CM

## 2024-07-06 ENCOUNTER — Other Ambulatory Visit: Payer: Self-pay | Admitting: Family Medicine

## 2024-07-06 DIAGNOSIS — F418 Other specified anxiety disorders: Secondary | ICD-10-CM

## 2024-08-01 ENCOUNTER — Ambulatory Visit
Admission: RE | Admit: 2024-08-01 | Discharge: 2024-08-01 | Disposition: A | Discharge status: Home / Self Care | Source: Ambulatory Visit | Attending: Obstetrics and Gynecology | Admitting: Obstetrics and Gynecology

## 2024-08-01 DIAGNOSIS — Z1231 Encounter for screening mammogram for malignant neoplasm of breast: Secondary | ICD-10-CM

## 2024-08-01 DIAGNOSIS — Z1330 Encounter for screening examination for mental health and behavioral disorders, unspecified: Secondary | ICD-10-CM | POA: Diagnosis not present

## 2024-08-01 DIAGNOSIS — Z01419 Encounter for gynecological examination (general) (routine) without abnormal findings: Secondary | ICD-10-CM | POA: Diagnosis not present

## 2024-08-09 ENCOUNTER — Other Ambulatory Visit: Payer: Self-pay | Admitting: Family Medicine

## 2024-08-09 DIAGNOSIS — F418 Other specified anxiety disorders: Secondary | ICD-10-CM

## 2024-09-18 ENCOUNTER — Other Ambulatory Visit: Payer: Self-pay | Admitting: Family Medicine

## 2024-09-18 DIAGNOSIS — G43809 Other migraine, not intractable, without status migrainosus: Secondary | ICD-10-CM

## 2024-09-19 ENCOUNTER — Encounter: Payer: Self-pay | Admitting: Family Medicine

## 2024-09-20 ENCOUNTER — Other Ambulatory Visit: Payer: Self-pay | Admitting: Family Medicine

## 2024-09-20 MED ORDER — NONFORMULARY OR COMPOUNDED ITEM: 0 refills | Status: DC

## 2024-09-25 ENCOUNTER — Encounter: Payer: Self-pay | Admitting: Family Medicine

## 2024-09-25 ENCOUNTER — Ambulatory Visit (INDEPENDENT_AMBULATORY_CARE_PROVIDER_SITE_OTHER): Admitting: Family Medicine

## 2024-09-25 VITALS — BP 100/80 | HR 93 | Temp 98.1°F | Resp 16 | Ht 63.0 in | Wt 190.6 lb

## 2024-09-25 DIAGNOSIS — E66811 Obesity, class 1: Secondary | ICD-10-CM

## 2024-09-25 DIAGNOSIS — R110 Nausea: Secondary | ICD-10-CM | POA: Diagnosis not present

## 2024-09-25 MED ORDER — AZITHROMYCIN 250 MG PO TABS: ORAL_TABLET | ORAL | 0 refills | Status: AC

## 2024-09-25 MED ORDER — NONFORMULARY OR COMPOUNDED ITEM: 0 refills | Status: DC

## 2024-09-25 MED ORDER — MECLIZINE HCL 25 MG PO TABS: 25.0000 mg | ORAL_TABLET | Freq: Three times a day (TID) | ORAL | 2 refills | Status: AC | PRN

## 2024-09-25 MED ORDER — OSELTAMIVIR PHOSPHATE 75 MG PO CAPS: 75.0000 mg | ORAL_CAPSULE | Freq: Two times a day (BID) | ORAL | 0 refills | Status: AC

## 2024-09-25 MED ORDER — ONDANSETRON HCL 4 MG PO TABS: 4.0000 mg | ORAL_TABLET | Freq: Three times a day (TID) | ORAL | 2 refills | Status: AC | PRN

## 2024-09-25 NOTE — Progress Notes (Signed)
 "  Subjective:    Patient ID: Stacey Hickman, female    DOB: 1971/06/03, 54 y.o.   MRN: [id number]  Chief Complaint  Patient presents with   Weight Loss    HPI Patient is in today to discuss the new wegovy  pill.  Discussed the use of AI scribe software for clinical note transcription with the patient, who gave verbal consent to proceed.  History of Present Illness 54 year old female who presents with interest in weight loss medication, specifically Wegovy .  She is interested in weight loss medication, specifically Wegovy , and has been inquiring about the availability and prescription process for this medication. She noted that she saw it on her medication list but has not heard anything from her pharmacy regarding the prescription.  The medication dosage starts at 1.5 mg and increases to 4 mg, then 9 mg, and finally 25 mg. The shots are administered once a week, while the pill is taken daily.    Past Medical History:  Diagnosis Date   Depression    no problems   Dizziness    Gestational hypertension 05/27/2014   Headache(784.0)    Insomnia    Left knee injury 03/2011   was in immobilizer   Tendonitis of foot 11/2010   Right foot    Past Surgical History:  Procedure Laterality Date   IUD REMOVAL     OTHER SURGICAL HISTORY     laproscopic removal of IUD, perfed uterus   WISDOM TOOTH EXTRACTION      Family History  Problem Relation Age of Onset   Obesity Mother    Diabetes Mother        Prediabetic   Hypothyroidism Mother    Diabetes Father    Obesity Father    Obesity Sister    Diabetes Brother    Obesity Brother    Diabetes Brother    Hypertension Brother    Hyperlipidemia Brother    Hypothyroidism Brother    Diabetes Paternal Grandfather    Colon polyps Neg Hx    Colon cancer Neg Hx    Esophageal cancer Neg Hx    Stomach cancer Neg Hx    Rectal cancer Neg Hx     Social History   Socioeconomic History   Marital status: Married    Spouse name: Not on  file   Number of children: 3   Years of education: Not on file   Highest education level: Not on file  Occupational History   Occupation: PHARMACIST    Comment: [employer]  Tobacco Use   Smoking status: Never   Smokeless tobacco: Never  Vaping Use   Vaping status: Never Used  Substance and Sexual Activity   Alcohol use: Yes    Comment: occ, not with preg   Drug use: No   Sexual activity: Yes    Partners: Male  Other Topics Concern   Not on file  Social History Narrative   Exercising--  30 min 2x a week--- walking   Social Drivers of Health   Tobacco Use: Low Risk (09/25/2024)   Patient History    Smoking Tobacco Use: Never    Smokeless Tobacco Use: Never    Passive Exposure: Not on file  Financial Resource Strain: Not on file  Food Insecurity: Not on file  Transportation Needs: Not on file  Physical Activity: Not on file  Stress: Not on file  Social Connections: Not on file  Intimate Partner Violence: Not on file  Depression (PHQ2-9): Low Risk (09/25/2024)  Depression (PHQ2-9)    PHQ-2 Score: 2  Alcohol Screen: Not on file  Housing: Not on file  Utilities: Not on file  Health Literacy: Not on file    Outpatient Medications Prior to Visit  Medication Sig Dispense Refill   ALPRAZolam  (XANAX ) 0.25 MG tablet Take 1 tablet (0.25 mg total) by mouth 2 (two) times daily as needed for anxiety. 20 tablet 1   ARIPiprazole  (ABILIFY ) 15 MG tablet TAKE 1 TABLET (15 MG TOTAL) BY MOUTH DAILY. 90 tablet 1   buPROPion  (WELLBUTRIN  XL) 300 MG 24 hr tablet TAKE 1 TABLET BY MOUTH EVERY DAY 90 tablet 3   desvenlafaxine  (PRISTIQ ) 100 MG 24 hr tablet TAKE 1 TABLET BY MOUTH EVERY DAY 90 tablet 0   gabapentin  (NEURONTIN ) 100 MG capsule TAKE 1 CAPSULE (100 MG TOTAL) BY MOUTH 3 TIMES A DAY 90 capsule 3   ibuprofen  (ADVIL ,MOTRIN ) 600 MG tablet Take 1 tablet (600 mg total) by mouth every 6 (six) hours. 30 tablet 0   levonorgestrel (MIRENA) 20 MCG/24HR IUD 1 each by Intrauterine route once.      levothyroxine  (SYNTHROID ) 50 MCG tablet TAKE 1 TABLET BY MOUTH EVERY DAY 90 tablet 3   spironolactone  (ALDACTONE ) 50 MG tablet TAKE 1 TABLET BY MOUTH EVERY DAY 90 tablet 1   SUMAtriptan  (IMITREX ) 100 MG tablet Take 1 tablet (100 mg total) by mouth every 2 (two) hours as needed for migraine. Max 2 per 24hrs 10 tablet 3   topiramate  (TOPAMAX ) 50 MG tablet Take 2 tablets (100 mg total) by mouth 2 (two) times daily. Pt needs office visit for further refills. 360 tablet 0   fluticasone  (FLONASE ) 50 MCG/ACT nasal spray Place 2 sprays into both nostrils daily. 16 g 6   meclizine  (ANTIVERT ) 25 MG tablet Take 1 tablet (25 mg total) by mouth 3 (three) times daily as needed for dizziness. 30 tablet 0   NONFORMULARY OR COMPOUNDED ITEM Wegovy  1.5 mg #30 1 po every day 30 each 0   ondansetron  (ZOFRAN ) 4 MG tablet Take 1 tablet (4 mg total) by mouth every 8 (eight) hours as needed for nausea or vomiting. 20 tablet 0   cyclobenzaprine  (FLEXERIL ) 10 MG tablet Take 1 tablet (10 mg total) by mouth 3 (three) times daily as needed for muscle spasms. (Patient not taking: Reported on 09/25/2024) 30 tablet 0   No facility-administered medications prior to visit.    Allergies[1]  Review of Systems  Constitutional:  Negative for chills, fever and malaise/fatigue.  HENT:  Negative for congestion and hearing loss.   Eyes:  Negative for discharge.  Respiratory:  Negative for cough, sputum production and shortness of breath.   Cardiovascular:  Negative for chest pain, palpitations and leg swelling.  Gastrointestinal:  Negative for abdominal pain, blood in stool, constipation, diarrhea, heartburn, nausea and vomiting.  Genitourinary:  Negative for dysuria, frequency, hematuria and urgency.  Musculoskeletal:  Negative for back pain, falls and myalgias.  Skin:  Negative for rash.  Neurological:  Negative for dizziness, sensory change, loss of consciousness, weakness and headaches.  Endo/Heme/Allergies:  Negative for  environmental allergies. Does not bruise/bleed easily.  Psychiatric/Behavioral:  Negative for depression and suicidal ideas. The patient is not nervous/anxious and does not have insomnia.        Objective:    Physical Exam Vitals and nursing note reviewed.  Constitutional:      General: She is not in acute distress.    Appearance: Normal appearance. She is well-developed.  HENT:  Head: Normocephalic and atraumatic.  Eyes:     General: No scleral icterus.       Right eye: No discharge.        Left eye: No discharge.  Cardiovascular:     Rate and Rhythm: Normal rate and regular rhythm.     Heart sounds: No murmur heard. Pulmonary:     Effort: Pulmonary effort is normal. No respiratory distress.     Breath sounds: Normal breath sounds.  Musculoskeletal:        General: Normal range of motion.     Cervical back: Normal range of motion and neck supple.     Right lower leg: No edema.     Left lower leg: No edema.  Skin:    General: Skin is warm and dry.  Neurological:     Mental Status: She is alert and oriented to person, place, and time.  Psychiatric:        Mood and Affect: Mood normal.        Behavior: Behavior normal.        Thought Content: Thought content normal.        Judgment: Judgment normal.     BP 100/80 (BP Location: Left Arm, Patient Position: Sitting, Cuff Size: Large)   Pulse 93   Temp 98.1 F (36.7 C) (Oral)   Resp 16   Ht 5' 3 (1.6 m)   Wt 190 lb 9.6 oz (86.5 kg)   SpO2 97%   BMI 33.76 kg/m  Wt Readings from Last 3 Encounters:  09/25/24 190 lb 9.6 oz (86.5 kg)  02/10/24 187 lb (84.8 kg)  01/06/23 181 lb 6.4 oz (82.3 kg)    Diabetic Foot Exam - Simple   No data filed    Lab Results  Component Value Date   WBC 5.8 02/27/2024   HGB 15.1 02/27/2024   HCT 45.8 (H) 02/27/2024   PLT 184 02/27/2024   GLUCOSE 89 02/10/2024   CHOL 178 02/10/2024   TRIG 196.0 (H) 02/10/2024   HDL 43.90 02/10/2024   LDLCALC 95 02/10/2024   ALT 33 02/10/2024    AST 26 02/10/2024   NA 140 02/10/2024   K 4.5 02/10/2024   CL 105 02/10/2024   CREATININE 1.10 02/10/2024   BUN 16 02/10/2024   CO2 25 02/10/2024   TSH 3.60 02/10/2024   HGBA1C 4.9 11/20/2020    Lab Results  Component Value Date   TSH 3.60 02/10/2024   Lab Results  Component Value Date   WBC 5.8 02/27/2024   HGB 15.1 02/27/2024   HCT 45.8 (H) 02/27/2024   MCV 91.8 02/27/2024   PLT 184 02/27/2024   Lab Results  Component Value Date   NA 140 02/10/2024   K 4.5 02/10/2024   CO2 25 02/10/2024   GLUCOSE 89 02/10/2024   BUN 16 02/10/2024   CREATININE 1.10 02/10/2024   BILITOT 0.6 02/10/2024   ALKPHOS 65 02/10/2024   AST 26 02/10/2024   ALT 33 02/10/2024   PROT 7.9 02/10/2024   ALBUMIN 4.8 02/10/2024   CALCIUM 9.3 02/10/2024   ANIONGAP 14 05/27/2014   GFR 57.50 (L) 02/10/2024   Lab Results  Component Value Date   CHOL 178 02/10/2024   Lab Results  Component Value Date   HDL 43.90 02/10/2024   Lab Results  Component Value Date   LDLCALC 95 02/10/2024   Lab Results  Component Value Date   TRIG 196.0 (H) 02/10/2024   Lab Results  Component Value Date  CHOLHDL 4 02/10/2024   Lab Results  Component Value Date   HGBA1C 4.9 11/20/2020       Assessment & Plan:  Obesity (BMI 30.0-34.9) -     NONFORMULARY OR COMPOUNDED ITEM; Wegovy  1.5 mg #30 1 po every day  Dispense: 30 each; Refill: 0  Nausea -     Meclizine  HCl; Take 1 tablet (25 mg total) by mouth 3 (three) times daily as needed for dizziness.  Dispense: 30 tablet; Refill: 2 -     Ondansetron  HCl; Take 1 tablet (4 mg total) by mouth every 8 (eight) hours as needed for nausea or vomiting.  Dispense: 20 tablet; Refill: 2  Other orders -     Azithromycin ; As directed  Dispense: 6 each; Refill: 0 -     Oseltamivir  Phosphate; Take 1 capsule (75 mg total) by mouth 2 (two) times daily.  Dispense: 10 capsule; Refill: 0  Assessment and Plan Assessment & Plan Obesity, class 1   Obesity, class 1, is being  managed with Wegovy  (semaglutide ) for weight loss. She is interested in starting the medication and has been informed about the dosing schedule, beginning at 1.5 mg daily with potential increases to 4 mg, 9 mg, and 25 mg as needed. The prescription has been sent to the pharmacy, but confirmation of receipt is pending. Will follow up with the pharmacy to confirm receipt of the prescription.    Latasha Buczkowski R Lowne Chase, DO     [1] No Known Allergies  "

## 2024-09-27 ENCOUNTER — Other Ambulatory Visit: Payer: Self-pay | Admitting: Family Medicine

## 2024-09-27 ENCOUNTER — Encounter: Payer: Self-pay | Admitting: Family Medicine

## 2024-09-27 ENCOUNTER — Telehealth (HOSPITAL_BASED_OUTPATIENT_CLINIC_OR_DEPARTMENT_OTHER): Payer: Self-pay

## 2024-09-27 ENCOUNTER — Other Ambulatory Visit (HOSPITAL_BASED_OUTPATIENT_CLINIC_OR_DEPARTMENT_OTHER): Payer: Self-pay

## 2024-09-27 ENCOUNTER — Other Ambulatory Visit (HOSPITAL_COMMUNITY): Payer: Self-pay

## 2024-09-27 ENCOUNTER — Other Ambulatory Visit: Payer: Self-pay

## 2024-09-27 DIAGNOSIS — E66811 Obesity, class 1: Secondary | ICD-10-CM

## 2024-09-27 MED ORDER — WEGOVY 1.5 MG PO TABS
1.5000 mg | ORAL_TABLET | Freq: Every day | ORAL | 0 refills | Status: DC
  Filled 2024-09-27 (×3): qty 30, 30d supply, fill #0

## 2024-09-27 MED ORDER — WEGOVY 1.5 MG PO TABS: 1.5000 mg | ORAL_TABLET | Freq: Every day | ORAL | 0 refills | Status: AC

## 2024-09-27 NOTE — Telephone Encounter (Signed)
 PCP resent Rx to [facility] pharmacy

## 2024-09-27 NOTE — Telephone Encounter (Signed)
 Pharmacy Patient Advocate Encounter   Received notification from Pt Calls Messages that prior authorization for Wegovy  1.5 mg tablets is required/requested.   Insurance verification completed.   The patient is insured through [facility].   Per test claim: Per test claim, medication is not covered due to plan/benefit exclusion, PA not submitted at this time

## 2024-10-06 ENCOUNTER — Other Ambulatory Visit: Payer: Self-pay | Admitting: Family Medicine

## 2024-10-06 DIAGNOSIS — F418 Other specified anxiety disorders: Secondary | ICD-10-CM

## 2025-02-15 ENCOUNTER — Encounter: Admitting: Family Medicine
